# Patient Record
Sex: Female | Born: 1990 | Race: Black or African American | Hispanic: No | Marital: Single | Smoking: Former smoker
Health system: Southern US, Community
[De-identification: ages and names within clinical notes are randomized; demographics above are authoritative.]

## PROBLEM LIST (undated history)

## (undated) DIAGNOSIS — G8929 Other chronic pain: Secondary | ICD-10-CM

## (undated) DIAGNOSIS — R519 Headache, unspecified: Secondary | ICD-10-CM

## (undated) HISTORY — DX: Other chronic pain: G89.29

## (undated) HISTORY — PX: ABDOMINAL SURGERY: SHX537

## (undated) HISTORY — DX: Headache, unspecified: R51.9

---
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
## 2012-07-24 ENCOUNTER — Emergency Department: Payer: Self-pay | Admitting: Emergency Medicine

## 2012-07-24 LAB — WET PREP, GENITAL

## 2012-07-24 LAB — CBC
HCT: 41.9 % (ref 35.0–47.0)
MCHC: 32.1 g/dL (ref 32.0–36.0)
Platelet: 258 10*3/uL (ref 150–440)
RBC: 5.01 10*6/uL (ref 3.80–5.20)
RDW: 13.7 % (ref 11.5–14.5)

## 2012-07-24 LAB — URINALYSIS, COMPLETE
Glucose,UR: NEGATIVE mg/dL (ref 0–75)
Nitrite: NEGATIVE
Ph: 6 (ref 4.5–8.0)
RBC,UR: 15 /HPF (ref 0–5)
Specific Gravity: 1.025 (ref 1.003–1.030)
WBC UR: 27 /HPF (ref 0–5)

## 2012-07-24 LAB — COMPREHENSIVE METABOLIC PANEL
Albumin: 3.8 g/dL (ref 3.8–5.6)
Alkaline Phosphatase: 69 U/L — ABNORMAL LOW (ref 82–169)
Anion Gap: 7 (ref 7–16)
BUN: 7 mg/dL (ref 7–18)
Bilirubin,Total: 0.8 mg/dL (ref 0.2–1.0)
Calcium, Total: 9.2 mg/dL (ref 9.0–10.7)
Chloride: 101 mmol/L (ref 98–107)
Creatinine: 0.75 mg/dL (ref 0.60–1.30)
Potassium: 3.3 mmol/L — ABNORMAL LOW (ref 3.5–5.1)
SGOT(AST): 16 U/L (ref 0–26)
SGPT (ALT): 20 U/L (ref 12–78)
Total Protein: 8.4 g/dL (ref 6.4–8.6)

## 2013-07-25 DIAGNOSIS — F39 Unspecified mood [affective] disorder: Secondary | ICD-10-CM | POA: Insufficient documentation

## 2013-07-25 DIAGNOSIS — F1410 Cocaine abuse, uncomplicated: Secondary | ICD-10-CM | POA: Insufficient documentation

## 2013-12-27 IMAGING — CR DG ABDOMEN 1V
1 series · 1 of 1 positions shown · non-contrast
Comparison: none

REASON FOR EXAM: abdominal pain
COMMENTS:

PROCEDURE:     DXR - DXR KIDNEY URETER BLADDER  - July 24, 2012  [DATE]
RESULT:     Near and fecal material are seen in the colon. There is no
abnormal colonic or small bowel distention. There is a moderate amount of
air in the stomach. No definite radiopaque urinary tract stones are evident.

[ap]
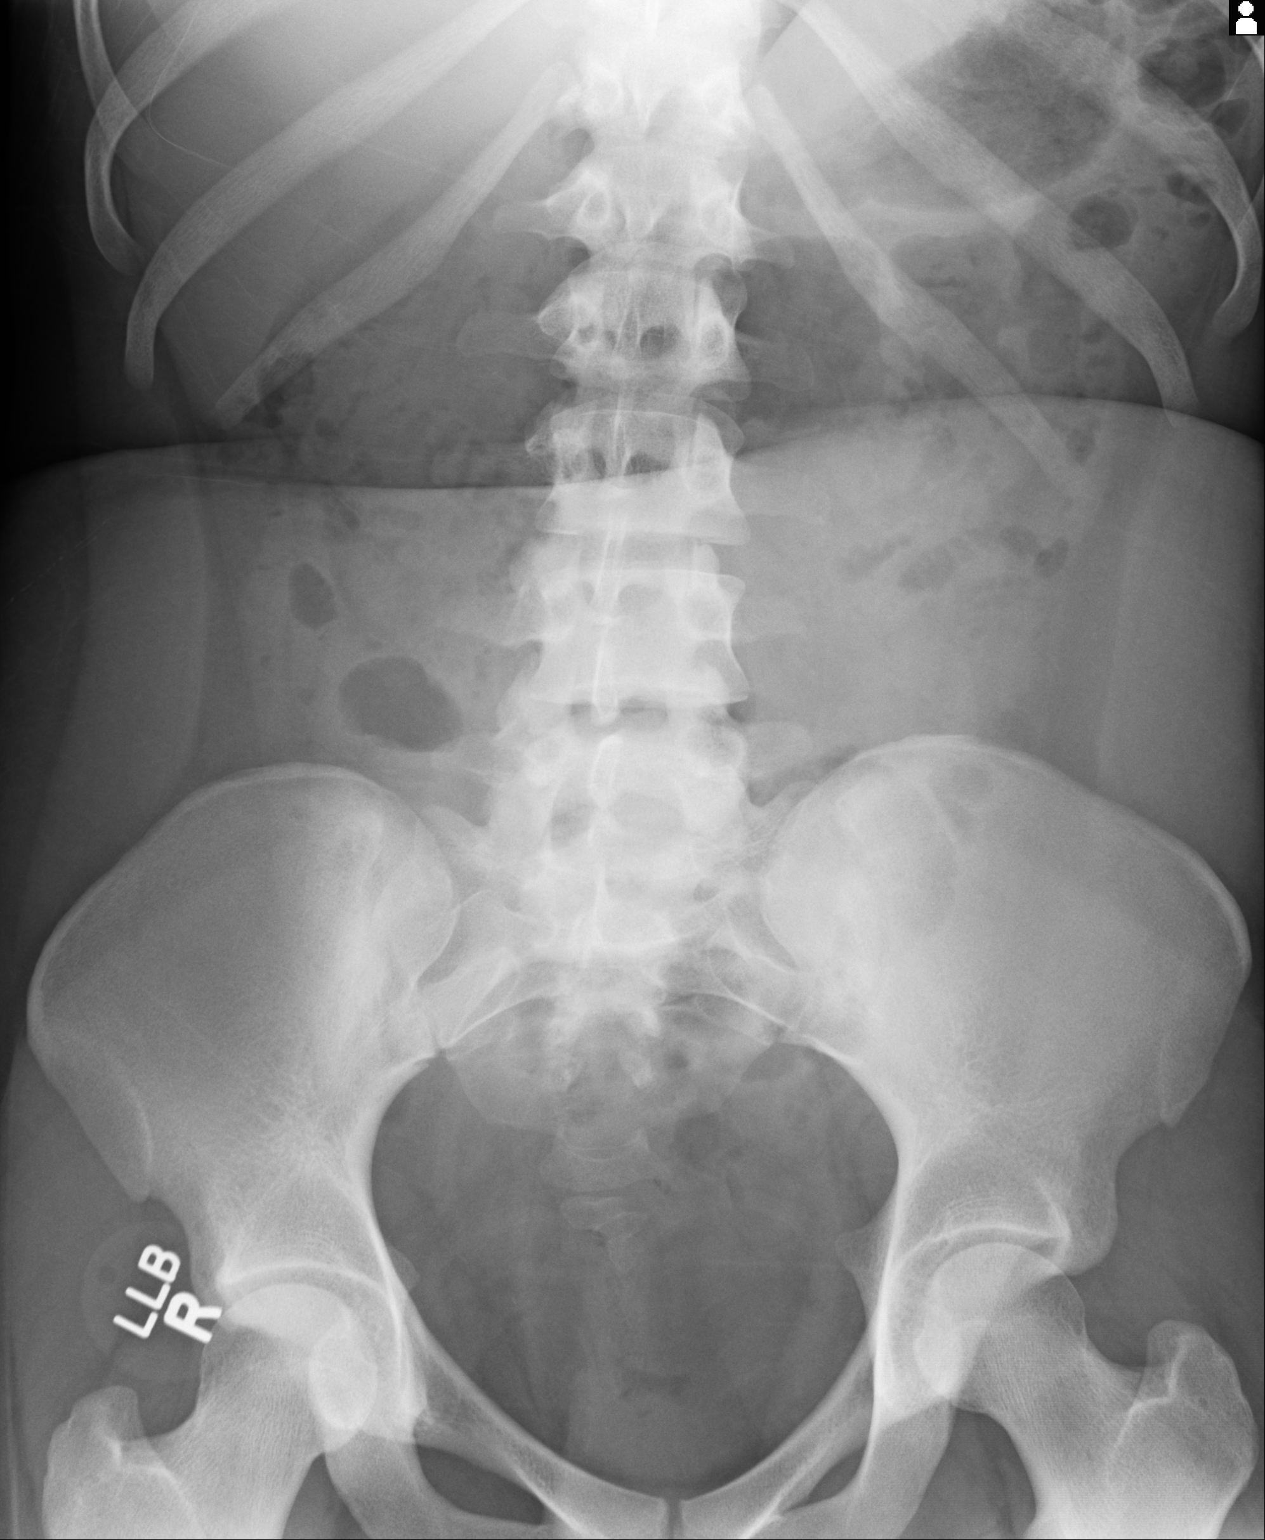

[1 of 1 positions shown; findings below may reference images not displayed]

IMPRESSION: No findings to suggest bowel obstruction or perforation.

[REDACTED]

## 2016-03-19 ENCOUNTER — Emergency Department
Admission: EM | Admit: 2016-03-19 | Discharge: 2016-03-19 | Disposition: A | Discharge status: Home / Self Care | Payer: Self-pay | Attending: Emergency Medicine | Admitting: Emergency Medicine

## 2016-03-19 ENCOUNTER — Encounter: Payer: Self-pay | Admitting: Emergency Medicine

## 2016-03-19 DIAGNOSIS — F17200 Nicotine dependence, unspecified, uncomplicated: Secondary | ICD-10-CM | POA: Insufficient documentation

## 2016-03-19 DIAGNOSIS — R350 Frequency of micturition: Secondary | ICD-10-CM | POA: Insufficient documentation

## 2016-03-19 LAB — URINALYSIS, COMPLETE (UACMP) WITH MICROSCOPIC
Bilirubin Urine: NEGATIVE
GLUCOSE, UA: NEGATIVE mg/dL
Hgb urine dipstick: NEGATIVE
KETONES UR: NEGATIVE mg/dL
Leukocytes, UA: NEGATIVE
Nitrite: NEGATIVE
PH: 6 (ref 5.0–8.0)
Protein, ur: NEGATIVE mg/dL
Specific Gravity, Urine: 1.025 (ref 1.005–1.030)

## 2016-03-19 LAB — COMPREHENSIVE METABOLIC PANEL
ALK PHOS: 46 U/L (ref 38–126)
ALT: 27 U/L (ref 14–54)
AST: 20 U/L (ref 15–41)
Albumin: 4.2 g/dL (ref 3.5–5.0)
Anion gap: 3 — ABNORMAL LOW (ref 5–15)
BILIRUBIN TOTAL: 0.6 mg/dL (ref 0.3–1.2)
BUN: 10 mg/dL (ref 6–20)
CALCIUM: 9 mg/dL (ref 8.9–10.3)
CO2: 29 mmol/L (ref 22–32)
CREATININE: 0.94 mg/dL (ref 0.44–1.00)
Chloride: 103 mmol/L (ref 101–111)
Glucose, Bld: 73 mg/dL (ref 65–99)
Potassium: 3.6 mmol/L (ref 3.5–5.1)
Sodium: 135 mmol/L (ref 135–145)
Total Protein: 7.5 g/dL (ref 6.5–8.1)

## 2016-03-19 LAB — CBC
HCT: 37.9 % (ref 35.0–47.0)
Hemoglobin: 12.4 g/dL (ref 12.0–16.0)
MCH: 27.6 pg (ref 26.0–34.0)
MCHC: 32.7 g/dL (ref 32.0–36.0)
MCV: 84.4 fL (ref 80.0–100.0)
PLATELETS: 267 10*3/uL (ref 150–440)
RBC: 4.49 MIL/uL (ref 3.80–5.20)
RDW: 13.6 % (ref 11.5–14.5)
WBC: 6.1 10*3/uL (ref 3.6–11.0)

## 2016-03-19 LAB — LIPASE, BLOOD: Lipase: 19 U/L (ref 11–51)

## 2016-03-19 LAB — POCT PREGNANCY, URINE: Preg Test, Ur: NEGATIVE

## 2016-03-19 NOTE — ED Triage Notes (Signed)
Pt reports LUQ abd pain for the past week. Pt states she has been nauseated and vomited x3 today.  Pt states she also has been trying to conceive as well. Took preg test a month ago and was negative. LMP 11/30. Pt states she was recently treated for vaginal DC but states she also had a UTI, but states [facility] told her she did not have one. Pt reports urinary frequency today, but denies any vaginal discharge.

## 2016-03-19 NOTE — ED Provider Notes (Signed)
[facility] Emergency Department Provider Note ____________________________________________   I have reviewed the triage vital signs and the triage nursing note.  HISTORY  Chief Complaint Abdominal Pain   Historian Patient  HPI Courtney Byrd is a 25 y.o. female presents due to a few weeks of frequency of urination.  She states she's being asked at work if there is a problem because she asks for some any bathroom brakes. She states that occasionally she will have some left upper abdominal discomfort, denies any now. She is mostly wondering whether or not she has urinary tract infection or if she is pregnant. She states that she's been trying to get pregnant for 3 years now without success. She does not have insurance and does not have a primary doctor or in OB/GYN doctor.  She states that she was seen at [facility] about a week ago and had a pelvic exam and STD testing and was told it was negative. She states that she was told that she did not have a urinary tract infection.  Symptoms of frequency and urination are mild. No hematuria. No pain with urination. No back pain. Denies vaginal discharge or vaginal bleeding although states that when her periods, it short but heavy.    History reviewed. No pertinent past medical history.  There are no active problems to display for this patient.   Past Surgical History:  Procedure Laterality Date  . ABDOMINAL SURGERY      Prior to Admission medications   Not on File    No Known Allergies  History reviewed. No pertinent family history.  Social History Social History  Substance Use Topics  . Smoking status: Current Every Day Smoker    Packs/day: 0.50    Years: 4.00  . Smokeless tobacco: Never Used  . Alcohol use Yes     Comment: rare use    Review of Systems  Constitutional: Negative for fever. Eyes: Negative for visual changes. ENT: Negative for sore throat. Cardiovascular: Negative for chest  pain. Respiratory: Negative for shortness of breath. Gastrointestinal: Negative for Diarrhea Genitourinary: Negative for dysuria. Positive for frequency of urination. Musculoskeletal: Negative for back pain. Skin: Negative for rash. Neurological: Negative for headache. 10 point Review of Systems otherwise negative ____________________________________________   PHYSICAL EXAM:  VITAL SIGNS: ED Triage Vitals  Enc Vitals Group     BP 03/19/16 1601 118/73     Pulse Rate 03/19/16 1601 84     Resp 03/19/16 1601 18     Temp 03/19/16 1601 98.3 F (36.8 C)     Temp Source 03/19/16 1601 Oral     SpO2 03/19/16 1601 100 %     Weight 03/19/16 1603 170 lb (77.1 kg)     Height 03/19/16 1603 5\' 3"  (1.6 m)     Head Circumference --      Peak Flow --      Pain Score 03/19/16 1603 5     Pain Loc --      Pain Edu? --      Excl. in GC? --      Constitutional: Alert and oriented. Well appearing and in no distress. HEENT   Head: Normocephalic and atraumatic.      Eyes: Conjunctivae are normal. PERRL. Normal extraocular movements.      Ears:         Nose: No congestion/rhinnorhea.   Mouth/Throat: Mucous membranes are moist.   Neck: No stridor. Cardiovascular/Chest: Normal rate, regular rhythm.  No murmurs, rubs, or gallops. Respiratory:  Normal respiratory effort without tachypnea nor retractions. Breath sounds are clear and equal bilaterally. No wheezes/rales/rhonchi. Gastrointestinal: Soft. No distention, no guarding, no rebound. Nontender in 4 quadrants.  Genitourinary/rectal: Declined Musculoskeletal: Nontender with normal range of motion in all extremities. No joint effusions.  No lower extremity tenderness.  No edema. Neurologic:  Normal speech and language. No gross or focal neurologic deficits are appreciated. Skin:  Skin is warm, dry and intact. No rash noted. Psychiatric: Mood and affect are normal. Speech and behavior are normal. Patient exhibits appropriate insight and  judgment.   ____________________________________________  LABS (pertinent positives/negatives)  Labs Reviewed  COMPREHENSIVE METABOLIC PANEL - Abnormal; Notable for the following:       Result Value   Anion gap 3 (*)    All other components within normal limits  URINALYSIS, COMPLETE (UACMP) WITH MICROSCOPIC - Abnormal; Notable for the following:    Color, Urine YELLOW (*)    APPearance HAZY (*)    Bacteria, UA RARE (*)    Squamous Epithelial / LPF 6-30 (*)    All other components within normal limits  URINE CULTURE  LIPASE, BLOOD  CBC  POC URINE PREG, ED  POCT PREGNANCY, URINE    ____________________________________________    EKG I, Governor Rooksebecca Tierria Watson, MD, the attending physician have personally viewed and interpreted all ECGs.  None ____________________________________________  RADIOLOGY All Xrays were viewed by me. Imaging interpreted by Radiologist.  None __________________________________________  PROCEDURES  Procedure(s) performed: None  Critical Care performed: None  ____________________________________________   ED COURSE / ASSESSMENT AND PLAN  Pertinent labs & imaging results that were available during my care of the patient were reviewed by me and considered in my medical decision making (see chart for details).    Ms. [name] is well-appearing and when brought back to the realm I was told she was stating that she was ready to go already.  While in to talk with her, it sounds like her main concern was whether or not she was pregnant, given that she's been trying to get pregnant for 3 years now. I did discuss with her that her pregnancy test is negative and that if indeed she has been tried for 3 years, that is probably outside the range of normal and if she is wanting to become pregnant, she should probably have a fertility evaluation with an OB/GYN. Given that she does not have insurance, she asked for referral, and I did indicate she could try the OB/GYN  on-call, or alternatively [facility]  The main symptom that she is having is frequency of urination, and her urinalysis does not appear to be consistent with urinary tract infection, I did discuss with her that I will send it for culture.  She's having no abdominal pain now. I did discuss that I recommended pelvic exam, she declined given that she was tested one week ago and I think this is probably reasonable.  I referred her for primary care doctor, as well as OB/GYN follow-up.    CONSULTATIONS:   None   Patient / Family / Caregiver informed of clinical course, medical decision-making process, and agree with plan.   I discussed return precautions, follow-up instructions, and discharge instructions with patient and/or family.   ___________________________________________   FINAL CLINICAL IMPRESSION(S) / ED DIAGNOSES   Final diagnoses:  Frequency of urination              Note: This dictation was prepared with Dragon dictation. Any transcriptional errors that result from  this process are unintentional    Governor Rooksebecca Shaquitta Burbridge, MD 03/19/16 [phone]

## 2016-03-19 NOTE — ED Notes (Signed)
See triage note  States she has had pain to left mid abd intermittent for the past week  Pain is worse after eating  No fever but has had some n/v  Vomited times 3 today

## 2016-03-19 NOTE — Discharge Instructions (Signed)
You were evaluated for frequent urination, and although no certain cause was found, your exam and evaluation are reassuring in the emergency department today.  I did recommend pelvic exam, but you declined given exam was done last week.  Return to the emergency department for any worsening symptoms including pain with urination, back pain, abdominal pain, black or bloody stool, vomiting, or any other symptoms concerning to you.  We did discuss that if you are trying to get pregnant, I would recommend seeing an OB/GYN for discussion of possible infertility evaluation.

## 2016-03-21 LAB — URINE CULTURE

## 2016-04-10 ENCOUNTER — Encounter: Payer: Self-pay | Admitting: Emergency Medicine

## 2016-04-10 ENCOUNTER — Emergency Department
Admission: EM | Admit: 2016-04-10 | Discharge: 2016-04-10 | Disposition: A | Discharge status: Home / Self Care | Payer: Self-pay | Attending: Emergency Medicine | Admitting: Emergency Medicine

## 2016-04-10 DIAGNOSIS — F17200 Nicotine dependence, unspecified, uncomplicated: Secondary | ICD-10-CM | POA: Insufficient documentation

## 2016-04-10 DIAGNOSIS — J029 Acute pharyngitis, unspecified: Secondary | ICD-10-CM | POA: Insufficient documentation

## 2016-04-10 LAB — POCT RAPID STREP A: STREPTOCOCCUS, GROUP A SCREEN (DIRECT): NEGATIVE

## 2016-04-10 MED ORDER — PSEUDOEPH-BROMPHEN-DM 30-2-10 MG/5ML PO SYRP: 5.0000 mL | ORAL_SOLUTION | Freq: Four times a day (QID) | ORAL | 0 refills | Status: AC | PRN

## 2016-04-10 MED ORDER — FIRST-DUKES MOUTHWASH MT SUSP: 10.0000 mL | Freq: Four times a day (QID) | OROMUCOSAL | 0 refills | Status: AC

## 2016-04-10 MED ORDER — LIDOCAINE VISCOUS 2 % MT SOLN
15.0000 mL | Freq: Once | OROMUCOSAL | Status: AC
  Administered 2016-04-10: 15 mL via OROMUCOSAL
  Filled 2016-04-10: qty 15

## 2016-04-10 NOTE — ED Triage Notes (Signed)
Sore throat since yesterday.

## 2016-04-10 NOTE — ED Provider Notes (Signed)
[facility] Emergency Department Provider Note   ____________________________________________   First MD Initiated Contact with Patient 04/10/16 1059     (approximate)  I have reviewed the triage vital signs and the nursing notes.   HISTORY  Chief Complaint Sore Throat    HPI Courtney Byrd is a 26 y.o. female patient complaining of sore throat for one day. Patient state difficulty with swallowing but can tolerate food and fluids. Patient also complaining of nasal congestion postnasal drainage. Patient denies any fever or chills with this complaint. Patient denies nausea, vomiting, or diarrhea. No palliative measures for this complaint. Patient rates the pain as a 10 over 10.   History reviewed. No pertinent past medical history.  There are no active problems to display for this patient.   Past Surgical History:  Procedure Laterality Date  . ABDOMINAL SURGERY      Prior to Admission medications   Medication Sig Start Date End Date Taking? Authorizing Provider  brompheniramine-pseudoephedrine-DM 30-2-10 MG/5ML syrup Take 5 mLs by mouth 4 (four) times daily as needed. 04/10/16   Joni Reiningonald K Kynzlee Hucker, PA-C  Diphenhyd-Hydrocort-Nystatin (FIRST-DUKES MOUTHWASH) SUSP Use as directed 10 mLs in the mouth or throat 4 (four) times daily. 04/10/16   Joni Reiningonald K Jaice Digioia, PA-C    Allergies Patient has no known allergies.  No family history on file.  Social History Social History  Substance Use Topics  . Smoking status: Current Every Day Smoker    Packs/day: 0.50    Years: 4.00  . Smokeless tobacco: Never Used  . Alcohol use Yes     Comment: rare use    Review of Systems Constitutional: No fever/chills Eyes: No visual changes. ENT: Sore throat and nasal congestion. Cardiovascular: Denies chest pain. Respiratory: Denies shortness of breath. Gastrointestinal: No abdominal pain.  No nausea, no vomiting.  No diarrhea.  No constipation. Genitourinary: Negative for  dysuria. Musculoskeletal: Negative for back pain. Skin: Negative for rash. Neurological: Negative for headaches, focal weakness or numbness.    ____________________________________________   PHYSICAL EXAM:  VITAL SIGNS: ED Triage Vitals  Enc Vitals Group     BP 04/10/16 0948 (!) 103/56     Pulse Rate 04/10/16 0948 100     Resp 04/10/16 0948 20     Temp 04/10/16 0948 98.1 F (36.7 C)     Temp Source 04/10/16 0948 Oral     SpO2 04/10/16 0948 100 %     Weight 04/10/16 0949 160 lb (72.6 kg)     Height 04/10/16 0949 5\' 3"  (1.6 m)     Head Circumference --      Peak Flow --      Pain Score 04/10/16 0949 10     Pain Loc --      Pain Edu? --      Excl. in GC? --     Constitutional: Alert and oriented. Well appearing and in no acute distress. Eyes: Conjunctivae are normal. PERRL. EOMI. Head: Atraumatic. Nose: Edematous nasal turbinates clear rhinorrhea Mouth/Throat: Mucous membranes are moist.  Oropharynx erythematous.No exudate on tonsils. Neck: No stridor.  No cervical spine tenderness to palpation. Hematological/Lymphatic/Immunilogical: No cervical lymphadenopathy. Cardiovascular: Normal rate, regular rhythm. Grossly normal heart sounds.  Good peripheral circulation. Respiratory: Normal respiratory effort.  No retractions. Lungs CTAB. Gastrointestinal: Soft and nontender. No distention. No abdominal bruits. No CVA tenderness. Musculoskeletal: No lower extremity tenderness nor edema.  No joint effusions. Neurologic:  Normal speech and language. No gross focal neurologic deficits are appreciated. No gait  instability. Skin:  Skin is warm, dry and intact. No rash noted. Psychiatric: Mood and affect are normal. Speech and behavior are normal.  ____________________________________________   LABS (all labs ordered are listed, but only abnormal results are displayed)  Labs Reviewed  POCT RAPID STREP A    ____________________________________________  EKG   ____________________________________________  RADIOLOGY   ____________________________________________   PROCEDURES  Procedure(s) performed: None  Procedures  Critical Care performed: No  ____________________________________________   INITIAL IMPRESSION / ASSESSMENT AND PLAN / ED COURSE  Pertinent labs & imaging results that were available during my care of the patient were reviewed by me and considered in my medical decision making (see chart for details).  Pharyngitis. Patient given discharge care instructions. Patient given a prescription at [facility] mouthwash) felt DM. Patient advised [facility]. Patient given a work note for today.  Clinical Course      ____________________________________________   FINAL CLINICAL IMPRESSION(S) / ED DIAGNOSES  Final diagnoses:  Viral pharyngitis      NEW MEDICATIONS STARTED DURING THIS VISIT:  New Prescriptions   BROMPHENIRAMINE-PSEUDOEPHEDRINE-DM 30-2-10 MG/5ML SYRUP    Take 5 mLs by mouth 4 (four) times daily as needed.   DIPHENHYD-HYDROCORT-NYSTATIN (FIRST-DUKES MOUTHWASH) SUSP    Use as directed 10 mLs in the mouth or throat 4 (four) times daily.     Note:  This document was prepared using Dragon voice recognition software and may include unintentional dictation errors.    Joni Reining, PA-C 04/10/16 1112    Phineas Semen, MD 04/10/16 [phone]

## 2017-02-01 ENCOUNTER — Encounter: Payer: Self-pay | Admitting: *Deleted

## 2017-02-01 ENCOUNTER — Emergency Department
Admission: EM | Admit: 2017-02-01 | Discharge: 2017-02-01 | Disposition: A | Discharge status: Home / Self Care | Payer: Self-pay | Attending: Emergency Medicine | Admitting: Emergency Medicine

## 2017-02-01 DIAGNOSIS — F17200 Nicotine dependence, unspecified, uncomplicated: Secondary | ICD-10-CM | POA: Insufficient documentation

## 2017-02-01 DIAGNOSIS — J029 Acute pharyngitis, unspecified: Secondary | ICD-10-CM | POA: Insufficient documentation

## 2017-02-01 DIAGNOSIS — G44209 Tension-type headache, unspecified, not intractable: Secondary | ICD-10-CM | POA: Insufficient documentation

## 2017-02-01 MED ORDER — AMOXICILLIN 500 MG PO TABS: 500.0000 mg | ORAL_TABLET | Freq: Two times a day (BID) | ORAL | 0 refills | Status: DC

## 2017-02-01 MED ORDER — CETIRIZINE HCL 10 MG PO TABS: 10.0000 mg | ORAL_TABLET | Freq: Every day | ORAL | 0 refills | Status: AC

## 2017-02-01 MED ORDER — IBUPROFEN 800 MG PO TABS
800.0000 mg | ORAL_TABLET | Freq: Once | ORAL | Status: AC
  Administered 2017-02-01: 800 mg via ORAL
  Filled 2017-02-01: qty 1

## 2017-02-01 MED ORDER — FLUTICASONE PROPIONATE 50 MCG/ACT NA SUSP: 1.0000 | Freq: Every day | NASAL | 0 refills | Status: AC

## 2017-02-01 MED ORDER — IBUPROFEN 800 MG PO TABS: 800.0000 mg | ORAL_TABLET | Freq: Three times a day (TID) | ORAL | 0 refills | Status: AC | PRN

## 2017-02-01 NOTE — ED Triage Notes (Signed)
States sore throat, headache and nasal congestion for 1 week

## 2017-02-01 NOTE — ED Provider Notes (Signed)
[facility] Emergency Department Provider Note   ____________________________________________   I have reviewed the triage vital signs and the nursing notes.   HISTORY  Chief Complaint Headache and Sore Throat    HPI Courtney Byrd is a 26 y.o. female presents to the emergency department with sore throat, headache, sinus pressure, watery and itchy eyes and nasal congestion for approximately 1 week.  Patient denies associated fever with above symptoms or sick contacts.  Patient reports history of strep throat in the past although current sore throat does not feel similar.  Patient works in [employer] so there is a high likelihood of sick contacts in her workplace.  Patient reported headache occurred after other symptoms had persisted.  Patient reports a history of environmental and seasonal allergies. Patient denies fever, chills, headache, vision changes, chest pain, chest tightness, shortness of breath, abdominal pain, nausea and vomiting.  History reviewed. No pertinent past medical history.  There are no active problems to display for this patient.   Past Surgical History:  Procedure Laterality Date  . ABDOMINAL SURGERY      Prior to Admission medications   Medication Sig Start Date End Date Taking? Authorizing Provider  amoxicillin (AMOXIL) 500 MG tablet Take 1 tablet (500 mg total) by mouth 2 (two) times daily. 02/01/17   Martell Mcfadyen M, PA-C  brompheniramine-pseudoephedrine-DM 30-2-10 MG/5ML syrup Take 5 mLs by mouth 4 (four) times daily as needed. 04/10/16   Joni ReiningSmith, Ronald K, PA-C  cetirizine (ZYRTEC) 10 MG tablet Take 1 tablet (10 mg total) by mouth daily. 02/01/17   Jaydyn Menon M, PA-C  Diphenhyd-Hydrocort-Nystatin (FIRST-DUKES MOUTHWASH) SUSP Use as directed 10 mLs in the mouth or throat 4 (four) times daily. 04/10/16   Joni ReiningSmith, Ronald K, PA-C  fluticasone (FLONASE) 50 MCG/ACT nasal spray Place 1 spray into both nostrils daily. 02/01/17 02/01/18   Jenipher Havel M, PA-C  ibuprofen (ADVIL,MOTRIN) 800 MG tablet Take 1 tablet (800 mg total) by mouth every 8 (eight) hours as needed. 02/01/17   Rigo Letts M, PA-C    Allergies Patient has no known allergies.  History reviewed. No pertinent family history.  Social History Social History  Substance Use Topics  . Smoking status: Current Every Day Smoker    Packs/day: 0.50    Years: 4.00  . Smokeless tobacco: Never Used  . Alcohol use Yes     Comment: rare use    Review of Systems Constitutional: Negative for fever/chills Eyes: No visual changes.  Positive for itchy and watery eyes. ENT:  Positive for sore throat and negative for difficulty swallowing.  Sinus pressure and nasal congestion. Cardiovascular: Denies chest pain. Respiratory: Denies cough. Denies shortness of breath. Gastrointestinal: No abdominal pain.  No nausea, vomiting, diarrhea. Skin: Negative for rash. Neurological: Positive for headaches.  ____________________________________________   PHYSICAL EXAM:  VITAL SIGNS: ED Triage Vitals  Enc Vitals Group     BP 02/01/17 1809 101/64     Pulse Rate 02/01/17 1809 80     Resp 02/01/17 1809 20     Temp 02/01/17 1809 98.7 F (37.1 C)     Temp Source 02/01/17 1809 Oral     SpO2 02/01/17 1809 100 %     Weight 02/01/17 1701 160 lb (72.6 kg)     Height 02/01/17 1701 5\' 3"  (1.6 m)     Head Circumference --      Peak Flow --      Pain Score 02/01/17 1701 7     Pain  Loc --      Pain Edu? --      Excl. in GC? --     Constitutional: Alert and oriented. Well appearing and in no acute distress.  Eyes: Conjunctivae are normal. PERRL.  Head: Normocephalic and atraumatic. ENT: Sinus pressure with positive tap test over frontal and maxillary sinus cavities.      Ears: Canals clear. TMs intact bilaterally.      Nose: Congestion/rhinnorhea.  Nasal congestion.      Mouth/Throat: Mucous membranes are moist. Oropharynx mild erythema.  No exudate present.  Uvula  midline. Neck:Supple. Cardiovascular: Normal rate, regular rhythm. Respiratory: Normal respiratory effort without tachypnea or retractions. Lungs CTAB.  Hematological/Lymphatic/Immunological: No cervical lymphadenopathy. Neurologic: Normal speech and language.  Skin:  Skin is warm, dry and intact. No rash noted. Psychiatric: Mood and affect are normal. Speech and behavior are normal. Patient exhibits appropriate insight and judgement.  ____________________________________________   LABS (all labs ordered are listed, but only abnormal results are displayed)  Labs Reviewed  POCT RAPID STREP A   ____________________________________________  EKG none ____________________________________________  RADIOLOGY none ____________________________________________   PROCEDURES  Procedure(s) performed: no    Critical Care performed: no ____________________________________________   INITIAL IMPRESSION / ASSESSMENT AND PLAN / ED COURSE  Pertinent labs & imaging results that were available during my care of the patient were reviewed by me and considered in my medical decision making (see chart for details).  Patient presents to the emergency department sore throat, headache, sinus pressure, watery and itchy eyes and nasal congestion for approximately 1 week. History and physical exam are reassuring symptoms are consistent with nasopharyngitis.  Point-of-care strep negative.  Patient will be prescribed amoxicillin for antibiotic coverage.  Recommended patient begin a regimen of Zyrtec and Flonase to manage allergy symptoms. Physical exam and vital signs were reassuring at this time. Patient informed of clinical course, understand medical decision-making process, and agree with plan. Patient was advised to follow up with PCPand was also advised to return to the emergency department for symptoms that change or worsen. ____________________________________________   FINAL CLINICAL  IMPRESSION(S) / ED DIAGNOSES  Final diagnoses:  Pharyngitis, unspecified etiology  Sore throat  Acute non intractable tension-type headache       NEW MEDICATIONS STARTED DURING THIS VISIT:  Discharge Medication List as of 02/01/2017  6:01 PM    START taking these medications   Details  amoxicillin (AMOXIL) 500 MG tablet Take 1 tablet (500 mg total) by mouth 2 (two) times daily., Starting Tue 02/01/2017, Print    cetirizine (ZYRTEC) 10 MG tablet Take 1 tablet (10 mg total) by mouth daily., Starting Tue 02/01/2017, Print    fluticasone (FLONASE) 50 MCG/ACT nasal spray Place 1 spray into both nostrils daily., Starting Tue 02/01/2017, Until Wed 02/01/2018, Print    ibuprofen (ADVIL,MOTRIN) 800 MG tablet Take 1 tablet (800 mg total) by mouth every 8 (eight) hours as needed., Starting Tue 02/01/2017, Print         Note:  This document was prepared using Dragon voice recognition software and may include unintentional dictation errors.   Clois Comber, PA-C 02/02/17 1012    Phineas Semen, MD 02/03/17 [phone]

## 2017-02-01 NOTE — Discharge Instructions (Signed)
Take medication as prescribed. Return to emergency department if symptoms worsen and follow-up with PCP as needed.   °

## 2017-02-02 LAB — POCT RAPID STREP A: Streptococcus, Group A Screen (Direct): NEGATIVE

## 2017-03-06 ENCOUNTER — Emergency Department
Admission: EM | Admit: 2017-03-06 | Discharge: 2017-03-06 | Disposition: A | Discharge status: Home / Self Care | Payer: Self-pay | Attending: Emergency Medicine | Admitting: Emergency Medicine

## 2017-03-06 ENCOUNTER — Other Ambulatory Visit: Payer: Self-pay

## 2017-03-06 ENCOUNTER — Encounter: Payer: Self-pay | Admitting: Emergency Medicine

## 2017-03-06 DIAGNOSIS — F17200 Nicotine dependence, unspecified, uncomplicated: Secondary | ICD-10-CM | POA: Insufficient documentation

## 2017-03-06 DIAGNOSIS — L0291 Cutaneous abscess, unspecified: Secondary | ICD-10-CM

## 2017-03-06 DIAGNOSIS — Z79899 Other long term (current) drug therapy: Secondary | ICD-10-CM | POA: Insufficient documentation

## 2017-03-06 DIAGNOSIS — N764 Abscess of vulva: Secondary | ICD-10-CM | POA: Insufficient documentation

## 2017-03-06 MED ORDER — HYDROCODONE-ACETAMINOPHEN 5-325 MG PO TABS
1.0000 | ORAL_TABLET | Freq: Once | ORAL | Status: AC
Start: 2017-03-06 — End: 2017-03-06
  Administered 2017-03-06: 1 via ORAL
  Filled 2017-03-06: qty 1

## 2017-03-06 MED ORDER — SULFAMETHOXAZOLE-TRIMETHOPRIM 800-160 MG PO TABS: 1.0000 | ORAL_TABLET | Freq: Two times a day (BID) | ORAL | 0 refills | Status: AC

## 2017-03-06 MED ORDER — LIDOCAINE HCL (PF) 1 % IJ SOLN
5.0000 mL | Freq: Once | INTRAMUSCULAR | Status: DC
  Filled 2017-03-06: qty 5

## 2017-03-06 NOTE — ED Provider Notes (Signed)
[facility] Emergency Department Provider Note  ____________________________________________  Time seen: Approximately 3:49 PM  I have reviewed the triage vital signs and the nursing notes.   HISTORY  Chief Complaint Abscess   HPI Courtney Byrd is a 26 y.o. female presents to the emergency department with a right labial abscess.  Patient reports that abscesses been apparent for the past 3 days.  She has a history of folliculitis but has never had a cutaneous abscess.  She has tried warm compress these.  She denies fever and chills.  History reviewed. No pertinent past medical history.  There are no active problems to display for this patient.   Past Surgical History:  Procedure Laterality Date  . ABDOMINAL SURGERY      Prior to Admission medications   Medication Sig Start Date End Date Taking? Authorizing Provider  amoxicillin (AMOXIL) 500 MG tablet Take 1 tablet (500 mg total) by mouth 2 (two) times daily. 02/01/17   Little, Traci M, PA-C  brompheniramine-pseudoephedrine-DM 30-2-10 MG/5ML syrup Take 5 mLs by mouth 4 (four) times daily as needed. 04/10/16   Joni ReiningSmith, Ronald K, PA-C  cetirizine (ZYRTEC) 10 MG tablet Take 1 tablet (10 mg total) by mouth daily. 02/01/17   Little, Traci M, PA-C  Diphenhyd-Hydrocort-Nystatin (FIRST-DUKES MOUTHWASH) SUSP Use as directed 10 mLs in the mouth or throat 4 (four) times daily. 04/10/16   Joni ReiningSmith, Ronald K, PA-C  fluticasone (FLONASE) 50 MCG/ACT nasal spray Place 1 spray into both nostrils daily. 02/01/17 02/01/18  Little, Traci M, PA-C  ibuprofen (ADVIL,MOTRIN) 800 MG tablet Take 1 tablet (800 mg total) by mouth every 8 (eight) hours as needed. 02/01/17   Little, Traci M, PA-C  sulfamethoxazole-trimethoprim (BACTRIM DS,SEPTRA DS) 800-160 MG tablet Take 1 tablet by mouth 2 (two) times daily for 7 days. 03/06/17 03/13/17  Orvil FeilWoods, Elliott Lasecki M, PA-C    Allergies Patient has no known allergies.  History reviewed. No pertinent family  history.  Social History Social History   Tobacco Use  . Smoking status: Current Every Day Smoker    Packs/day: 0.50    Years: 4.00    Pack years: 2.00  . Smokeless tobacco: Never Used  Substance Use Topics  . Alcohol use: Yes    Comment: rare use  . Drug use: No     Review of Systems  Constitutional: No fever/chills Eyes: No visual changes. No discharge ENT: No upper respiratory complaints. Cardiovascular: no chest pain. Respiratory: no cough. No SOB. Musculoskeletal: Negative for musculoskeletal pain. Skin: Patient has a right labial abscess. Neurological: Negative for headaches, focal weakness or numbness.   ____________________________________________   PHYSICAL EXAM:  VITAL SIGNS: ED Triage Vitals  Enc Vitals Group     BP 03/06/17 1510 (!) 132/58     Pulse Rate 03/06/17 1510 93     Resp --      Temp 03/06/17 1510 98.5 F (36.9 C)     Temp Source 03/06/17 1510 Oral     SpO2 03/06/17 1510 100 %     Weight 03/06/17 1510 160 lb (72.6 kg)     Height 03/06/17 1510 5\' 3"  (1.6 m)     Head Circumference --      Peak Flow --      Pain Score 03/06/17 1525 10     Pain Loc --      Pain Edu? --      Excl. in GC? --      Constitutional: Alert and oriented. Well appearing and in no  acute distress. Eyes: Conjunctivae are normal. PERRL. EOMI. Head: Atraumatic. Cardiovascular: Normal rate, regular rhythm. Normal S1 and S2.  Good peripheral circulation. Respiratory: Normal respiratory effort without tachypnea or retractions. Lungs CTAB. Good air entry to the bases with no decreased or absent breath sounds. Gastrointestinal: Bowel sounds 4 quadrants. Soft and nontender to palpation. No guarding or rigidity. No palpable masses. No distention. No CVA tenderness. Musculoskeletal: Full range of motion to all extremities. No gross deformities appreciated. Neurologic:  Normal speech and language. No gross focal neurologic deficits are appreciated.  Skin: Patient has a 3 cm x  3 cm right labial abscess.  Abscess is fluctuant with surrounding cellulitis. Psychiatric: Mood and affect are normal. Speech and behavior are normal. Patient exhibits appropriate insight and judgement.   ____________________________________________   LABS (all labs ordered are listed, but only abnormal results are displayed)  Labs Reviewed - No data to display ____________________________________________  EKG   ____________________________________________  RADIOLOGY   No results found.  ____________________________________________    PROCEDURES  Procedure(s) performed:    Procedures  INCISION AND DRAINAGE Performed by: Orvil FeilJaclyn M Marzelle Rutten Consent: Verbal consent obtained. Risks and benefits: risks, benefits and alternatives were discussed Type: abscess  Body area: Right labia majora  Anesthesia: local infiltration  Incision was made with a scalpel.  Local anesthetic: lidocaine 1% without epinephrine  Anesthetic total: 3 ml  Complexity: complex Blunt dissection to break up loculations  Drainage: purulent  Drainage amount: Copious  Patient tolerance: Patient tolerated the procedure well with no immediate complications.     Medications  HYDROcodone-acetaminophen (NORCO/VICODIN) 5-325 MG per tablet 1 tablet (1 tablet Oral Given 03/06/17 1553)     ____________________________________________   INITIAL IMPRESSION / ASSESSMENT AND PLAN / ED COURSE  Pertinent labs & imaging results that were available during my care of the patient were reviewed by me and considered in my medical decision making (see chart for details).  Review of the [place] CSRS was performed in accordance of the [facility] prior to dispensing any controlled drugs.     Assessment and Plan: Right labial abscess Patient presents to the emergency department with an abscess of the right labia majora.  Patient underwent incision and drainage without complication.  She was discharged with Bactrim  and advised to follow-up with primary care as needed.  All patient questions were answered.     ____________________________________________  FINAL CLINICAL IMPRESSION(S) / ED DIAGNOSES  Final diagnoses:  Abscess      NEW MEDICATIONS STARTED DURING THIS VISIT:  ED Discharge Orders        Ordered    sulfamethoxazole-trimethoprim (BACTRIM DS,SEPTRA DS) 800-160 MG tablet  2 times daily     03/06/17 1630          This chart was dictated using voice recognition software/Dragon. Despite best efforts to proofread, errors can occur which can change the meaning. Any change was purely unintentional.    Gasper LloydWoods, Malia Corsi M, PA-C 03/06/17 2018    Sharman CheekStafford, Phillip, MD 03/06/17 2312

## 2017-03-06 NOTE — ED Triage Notes (Signed)
Pt presents to ED with c/o possible abscess or ingrown hair to R side of her vagina. Pt states this started 2-3 days ago. Pt states her mother told her she could see a "head" and that it's possible that there are 2 abscesses.

## 2017-04-27 ENCOUNTER — Emergency Department
Admission: EM | Admit: 2017-04-27 | Discharge: 2017-04-27 | Disposition: A | Discharge status: Home / Self Care | Payer: Self-pay | Attending: Emergency Medicine | Admitting: Emergency Medicine

## 2017-04-27 DIAGNOSIS — J020 Streptococcal pharyngitis: Secondary | ICD-10-CM | POA: Insufficient documentation

## 2017-04-27 DIAGNOSIS — Z79899 Other long term (current) drug therapy: Secondary | ICD-10-CM | POA: Insufficient documentation

## 2017-04-27 DIAGNOSIS — F17200 Nicotine dependence, unspecified, uncomplicated: Secondary | ICD-10-CM | POA: Insufficient documentation

## 2017-04-27 LAB — GROUP A STREP BY PCR: Group A Strep by PCR: DETECTED — AB

## 2017-04-27 MED ORDER — DEXAMETHASONE SODIUM PHOSPHATE 10 MG/ML IJ SOLN
10.0000 mg | Freq: Once | INTRAMUSCULAR | Status: AC
  Administered 2017-04-27: 10 mg via INTRAMUSCULAR
  Filled 2017-04-27: qty 1

## 2017-04-27 MED ORDER — AMOXICILLIN 875 MG PO TABS: 875.0000 mg | ORAL_TABLET | Freq: Two times a day (BID) | ORAL | 0 refills | Status: AC

## 2017-04-27 NOTE — ED Provider Notes (Signed)
[facility] Emergency Department Provider Note  ____________________________________________  Time seen: Approximately 10:05 PM  I have reviewed the triage vital signs and the nursing notes.   HISTORY  Chief Complaint Sore Throat    HPI Courtney Byrd is a 27 y.o. female presents to the emergency department with pharyngitis, fever, tonsillar exudate, anterior neck discomfort and malaise for the past 3 days.  Patient is speaking in complete sentences and tolerating her own secretions.  No prior history of peritonsillar abscess.  No alleviating measures have been attempted.   History reviewed. No pertinent past medical history.  There are no active problems to display for this patient.   Past Surgical History:  Procedure Laterality Date  . ABDOMINAL SURGERY      Prior to Admission medications   Medication Sig Start Date End Date Taking? Authorizing Provider  amoxicillin (AMOXIL) 875 MG tablet Take 1 tablet (875 mg total) by mouth 2 (two) times daily for 10 days. 04/27/17 05/07/17  Orvil Feil, PA-C  brompheniramine-pseudoephedrine-DM 30-2-10 MG/5ML syrup Take 5 mLs by mouth 4 (four) times daily as needed. 04/10/16   Joni Reining, PA-C  cetirizine (ZYRTEC) 10 MG tablet Take 1 tablet (10 mg total) by mouth daily. 02/01/17   Little, Traci M, PA-C  Diphenhyd-Hydrocort-Nystatin (FIRST-DUKES MOUTHWASH) SUSP Use as directed 10 mLs in the mouth or throat 4 (four) times daily. 04/10/16   Joni Reining, PA-C  fluticasone (FLONASE) 50 MCG/ACT nasal spray Place 1 spray into both nostrils daily. 02/01/17 02/01/18  Little, Traci M, PA-C  ibuprofen (ADVIL,MOTRIN) 800 MG tablet Take 1 tablet (800 mg total) by mouth every 8 (eight) hours as needed. 02/01/17   Little, Traci M, PA-C    Allergies Patient has no known allergies.  No family history on file.  Social History Social History   Tobacco Use  . Smoking status: Current Every Day Smoker    Packs/day: 0.50   Years: 4.00    Pack years: 2.00  . Smokeless tobacco: Never Used  Substance Use Topics  . Alcohol use: Yes    Comment: rare use  . Drug use: No     Review of Systems  Constitutional: Patient has fever. Eyes: No visual changes. No discharge ENT: Patient has pharyngitis.  Cardiovascular: no chest pain. Respiratory: no cough. No SOB. Gastrointestinal: Patient has nausea.  Genitourinary: Negative for dysuria. No hematuria Musculoskeletal: Negative for musculoskeletal pain. Skin: Negative for rash, abrasions, lacerations, ecchymosis. Neurological: Patient has headache, no focal weakness or numbness.   ____________________________________________   PHYSICAL EXAM:  VITAL SIGNS: ED Triage Vitals  Enc Vitals Group     BP 04/27/17 1945 122/67     Pulse Rate 04/27/17 1945 (!) 109     Resp 04/27/17 1945 18     Temp 04/27/17 1945 (!) 100.7 F (38.2 C)     Temp Source 04/27/17 1945 Oral     SpO2 04/27/17 1945 99 %     Weight 04/27/17 1945 160 lb (72.6 kg)     Height 04/27/17 1945 5\' 3"  (1.6 m)     Head Circumference --      Peak Flow --      Pain Score 04/27/17 1949 10     Pain Loc --      Pain Edu? --      Excl. in GC? --      Constitutional: Alert and oriented. Well appearing and in no acute distress. Eyes: Conjunctivae are normal. PERRL. EOMI. Head: Atraumatic. ENT:  Ears: TMs are pearly bilaterally.      Nose: No congestion/rhinnorhea.      Mouth/Throat: Mucous membranes are moist.  Posterior pharynx is erythematous with bilateral tonsillar hypertrophy and exudate. Neck: No stridor.  Palpable cervical spine tenderness to palpation. Cardiovascular: Normal rate, regular rhythm. Normal S1 and S2.  Good peripheral circulation. Respiratory: Normal respiratory effort without tachypnea or retractions. Lungs CTAB. Good air entry to the bases with no decreased or absent breath sounds. Gastrointestinal: Bowel sounds 4 quadrants. Soft and nontender to palpation. No guarding  or rigidity. No palpable masses. No distention. No CVA tenderness. Skin:  Skin is warm, dry and intact. No rash noted. ____________________________________________   LABS (all labs ordered are listed, but only abnormal results are displayed)  Labs Reviewed  GROUP A STREP BY PCR - Abnormal; Notable for the following components:      Result Value   Group A Strep by PCR DETECTED (*)    All other components within normal limits   ____________________________________________  EKG   ____________________________________________  RADIOLOGY   No results found.  ____________________________________________    PROCEDURES  Procedure(s) performed:    Procedures    Medications  dexamethasone (DECADRON) injection 10 mg (10 mg Intramuscular Given 04/27/17 2118)     ____________________________________________   INITIAL IMPRESSION / ASSESSMENT AND PLAN / ED COURSE  Pertinent labs & imaging results that were available during my care of the patient were reviewed by me and considered in my medical decision making (see chart for details).  Review of the [place] CSRS was performed in accordance of the [facility] prior to dispensing any controlled drugs.     Assessment and plan Strep pharyngitis Patient presents to the emergency department with fever, pharyngitis, tonsillar exudate and anterior neck discomfort for the past 2 days.  Differential diagnosis included strep pharyngitis versus viral URI.  Patient tested positive for strep in the emergency department.  Patient was treated empirically with amoxicillin.  Vital signs were reassuring prior to discharge.  All patient questions were answered.    ____________________________________________  FINAL CLINICAL IMPRESSION(S) / ED DIAGNOSES  Final diagnoses:  Strep pharyngitis      NEW MEDICATIONS STARTED DURING THIS VISIT:  ED Discharge Orders        Ordered    amoxicillin (AMOXIL) 875 MG tablet  2 times daily     04/27/17 2106           This chart was dictated using voice recognition software/Dragon. Despite best efforts to proofread, errors can occur which can change the meaning. Any change was purely unintentional.    Gasper LloydWoods, Nekisha Mcdiarmid M, PA-C 04/27/17 2211    Sharman CheekStafford, Phillip, MD 04/27/17 2330

## 2017-04-27 NOTE — ED Triage Notes (Signed)
Patient c/o sore throat X 3 days. Patient reports headache.

## 2017-04-27 NOTE — ED Notes (Signed)
Pt has a  Sore throat for 2 days .  Fever today.  No cough.  Pt states it hurts to swallow.  Pt alert.

## 2018-05-07 ENCOUNTER — Emergency Department
Admission: EM | Admit: 2018-05-07 | Discharge: 2018-05-07 | Disposition: A | Discharge status: Home / Self Care | Payer: Self-pay | Attending: Emergency Medicine | Admitting: Emergency Medicine

## 2018-05-07 DIAGNOSIS — H66001 Acute suppurative otitis media without spontaneous rupture of ear drum, right ear: Secondary | ICD-10-CM | POA: Insufficient documentation

## 2018-05-07 DIAGNOSIS — F17200 Nicotine dependence, unspecified, uncomplicated: Secondary | ICD-10-CM | POA: Insufficient documentation

## 2018-05-07 DIAGNOSIS — Z79899 Other long term (current) drug therapy: Secondary | ICD-10-CM | POA: Insufficient documentation

## 2018-05-07 MED ORDER — AMOXICILLIN 875 MG PO TABS: 875.0000 mg | ORAL_TABLET | Freq: Two times a day (BID) | ORAL | 0 refills | Status: AC

## 2018-05-07 NOTE — ED Triage Notes (Signed)
Patient to ED for right ear pain and possible infection. Woke up with suddent onset of ear pain this morning; thought something was in her ear. Put peroxide in it and that stopped. Since then has been increasingly sore and states "I just got medicaid so I'm gonna get it all taken care of." Denies drainage or blood coming from the ear.

## 2018-05-07 NOTE — ED Provider Notes (Signed)
[facility] Emergency Department Provider Note  ____________________________________________  Time seen: Approximately 9:55 PM  I have reviewed the triage vital signs and the nursing notes.   HISTORY  Chief Complaint Otitis Media    HPI Courtney Byrd is a 28 y.o. female presents to the emergency department with 8 out of 10 aching right ear pain that started today.  Patient had rhinorrhea, congestion nonproductive cough earlier in the week.  No recent episodes of otitis media.  No changes in hearing or discharge from the right ear.  No tinnitus.  No alleviating measures have been attempted.   History reviewed. No pertinent past medical history.  There are no active problems to display for this patient.   Past Surgical History:  Procedure Laterality Date  . ABDOMINAL SURGERY      Prior to Admission medications   Medication Sig Start Date End Date Taking? Authorizing Provider  amoxicillin (AMOXIL) 875 MG tablet Take 1 tablet (875 mg total) by mouth 2 (two) times daily for 7 days. 05/07/18 05/14/18  Orvil FeilWoods, Chrysa Rampy M, PA-C  brompheniramine-pseudoephedrine-DM 30-2-10 MG/5ML syrup Take 5 mLs by mouth 4 (four) times daily as needed. 04/10/16   Joni ReiningSmith, Ronald K, PA-C  cetirizine (ZYRTEC) 10 MG tablet Take 1 tablet (10 mg total) by mouth daily. 02/01/17   Little, Traci M, PA-C  Diphenhyd-Hydrocort-Nystatin (FIRST-DUKES MOUTHWASH) SUSP Use as directed 10 mLs in the mouth or throat 4 (four) times daily. 04/10/16   Joni ReiningSmith, Ronald K, PA-C  fluticasone (FLONASE) 50 MCG/ACT nasal spray Place 1 spray into both nostrils daily. 02/01/17 02/01/18  Little, Traci M, PA-C  ibuprofen (ADVIL,MOTRIN) 800 MG tablet Take 1 tablet (800 mg total) by mouth every 8 (eight) hours as needed. 02/01/17   Little, Traci M, PA-C    Allergies Patient has no known allergies.  No family history on file.  Social History Social History   Tobacco Use  . Smoking status: Current Every Day Smoker   Packs/day: 0.50    Years: 4.00    Pack years: 2.00  . Smokeless tobacco: Never Used  Substance Use Topics  . Alcohol use: Yes    Comment: rare use  . Drug use: No     Review of Systems  Constitutional: No fever/chills Eyes: No visual changes. No discharge ENT: Patient has right ear pain.  Cardiovascular: no chest pain. Respiratory: no cough. No SOB. Gastrointestinal: No abdominal pain.  No nausea, no vomiting.  No diarrhea.  No constipation. Musculoskeletal: Negative for musculoskeletal pain. Skin: Negative for rash, abrasions, lacerations, ecchymosis. Neurological: Negative for headaches, focal weakness or numbness.  ____________________________________________   PHYSICAL EXAM:  VITAL SIGNS: ED Triage Vitals  Enc Vitals Group     BP 05/07/18 2020 106/67     Pulse Rate 05/07/18 2020 83     Resp 05/07/18 2020 20     Temp 05/07/18 2020 98.2 F (36.8 C)     Temp Source 05/07/18 2020 Oral     SpO2 05/07/18 2020 100 %     Weight 05/07/18 2021 160 lb (72.6 kg)     Height 05/07/18 2021 5\' 3"  (1.6 m)     Head Circumference --      Peak Flow --      Pain Score 05/07/18 2021 7     Pain Loc --      Pain Edu? --      Excl. in GC? --      Constitutional: Alert and oriented. Well appearing and in no acute  distress. Eyes: Conjunctivae are normal. PERRL. EOMI. Head: Atraumatic. ENT:      Ears: Right tympanic membrane is erythematous and effused.  Left TM is pearly.      Nose: No congestion/rhinnorhea.      Mouth/Throat: Mucous membranes are moist.  Neck: No stridor.  No cervical spine tenderness to palpation.  Cardiovascular: Normal rate, regular rhythm. Normal S1 and S2.  Good peripheral circulation. Respiratory: Normal respiratory effort without tachypnea or retractions. Lungs CTAB. Good air entry to the bases with no decreased or absent breath sounds. Skin:  Skin is warm, dry and intact. No rash noted. Psychiatric: Mood and affect are normal. Speech and behavior are  normal. Patient exhibits appropriate insight and judgement.   ____________________________________________   LABS (all labs ordered are listed, but only abnormal results are displayed)  Labs Reviewed - No data to display ____________________________________________  EKG   ____________________________________________  RADIOLOGY  No results found.  ____________________________________________    PROCEDURES  Procedure(s) performed:    Procedures    Medications - No data to display   ____________________________________________   INITIAL IMPRESSION / ASSESSMENT AND PLAN / ED COURSE  Pertinent labs & imaging results that were available during my care of the patient were reviewed by me and considered in my medical decision making (see chart for details).  Review of the [place] CSRS was performed in accordance of the [facility] prior to dispensing any controlled drugs.      Assessment and plan Otitis media Patient presents to the emergency department with right ear pain.  On physical exam, right TM is erythematous and bulging.  Physical exam findings are consistent with otitis media.  Patient was treated with amoxicillin.  Tylenol was recommended for discomfort.  She was advised to follow-up with primary care as needed.   ____________________________________________  FINAL CLINICAL IMPRESSION(S) / ED DIAGNOSES  Final diagnoses:  Acute suppurative otitis media of right ear without spontaneous rupture of tympanic membrane, recurrence not specified      NEW MEDICATIONS STARTED DURING THIS VISIT:  ED Discharge Orders         Ordered    amoxicillin (AMOXIL) 875 MG tablet  2 times daily     05/07/18 2140              This chart was dictated using voice recognition software/Dragon. Despite best efforts to proofread, errors can occur which can change the meaning. Any change was purely unintentional.    Orvil FeilWoods, Dejan Angert M, PA-C 05/07/18 2217    Jeanmarie PlantMcShane, James A,  MD 05/08/18 [phone]

## 2018-07-17 LAB — HM HIV SCREENING LAB: HM HIV Screening: NEGATIVE

## 2018-07-17 LAB — HM HEPATITIS C SCREENING LAB: HM Hepatitis Screen: NEGATIVE

## 2018-11-24 ENCOUNTER — Ambulatory Visit: Payer: Self-pay

## 2018-12-26 ENCOUNTER — Encounter: Payer: Self-pay | Admitting: Family Medicine

## 2018-12-26 ENCOUNTER — Ambulatory Visit: Payer: Self-pay | Admitting: Family Medicine

## 2018-12-26 ENCOUNTER — Other Ambulatory Visit: Payer: Self-pay

## 2018-12-26 DIAGNOSIS — Z113 Encounter for screening for infections with a predominantly sexual mode of transmission: Secondary | ICD-10-CM

## 2018-12-26 LAB — WET PREP FOR TRICH, YEAST, CLUE
Trichomonas Exam: NEGATIVE
Yeast Exam: NEGATIVE

## 2018-12-26 MED ORDER — THERA VITAL M PO TABS: 1.0000 | ORAL_TABLET | Freq: Every day | ORAL | 0 refills | Status: AC

## 2018-12-26 NOTE — Progress Notes (Signed)
Patient given MVI and pamphlet about planning a healthy pregnancy.Jenetta Downer, RN

## 2018-12-26 NOTE — Progress Notes (Signed)
Patient here for STD screening.Tommye Lehenbauer Brewer-Jensen, RN 

## 2018-12-26 NOTE — Progress Notes (Signed)
Patient wet mount reviewed, no treatment indicated at this time.Jenetta Downer, RN

## 2018-12-26 NOTE — Progress Notes (Signed)
    STI clinic/screening visit  Subjective:  Sawyer Twichell is a 28 y.o. female being seen today for an STI screening visit. The patient reports they do not have symptoms.  Patient has the following medical conditions:  There are no active problems to display for this patient.    Chief Complaint  Patient presents with  . Exposure to STD    HPI  Patient reports she is here for STD screen.  She has no sympts.  See flowsheet for further details and programmatic requirements.    The following portions of the patient's history were reviewed and updated as appropriate: allergies, current medications, past medical history, past social history, past surgical history and problem list.  Objective:  There were no vitals filed for this visit.  Physical Exam Constitutional:      Appearance: Normal appearance.  HENT:     Mouth/Throat:     Pharynx: Oropharynx is clear. No oropharyngeal exudate or posterior oropharyngeal erythema.  Neck:     Musculoskeletal: Neck supple. No muscular tenderness.  Abdominal:     Palpations: Abdomen is soft.     Tenderness: There is no abdominal tenderness.  Genitourinary:    General: Normal vulva.     Vagina: Vaginal discharge present.     Comments: sm amt of white disch, pH<4.5, no odor noted Lymphadenopathy:     Cervical: No cervical adenopathy.  Skin:    General: Skin is warm and dry.     Findings: No lesion or rash.    Assessment and Plan:  Aesha Schneck is a 28 y.o. female presenting to the [facility] Department for STI screening  1. Screening examination for venereal disease  - Multiple Vitamins-Minerals (MULTIVITAMIN) tablet; Take 1 tablet by mouth daily.  Dispense: 100 tablet; Refill: 0 - WET PREP FOR TRICH, YEAST, CLUE - Chlamydia/Gonorrhea [place] [facility] - HIV [place] [facility] - Syphilis Serology, [place] [facility]  Client denies sympts-no treatment today,   No follow-ups on file.  No future appointments.  Hassell Done, FNP

## 2019-08-31 ENCOUNTER — Ambulatory Visit: Payer: Medicaid Other

## 2019-09-04 ENCOUNTER — Other Ambulatory Visit: Payer: Self-pay

## 2019-09-04 ENCOUNTER — Ambulatory Visit: Payer: Self-pay | Admitting: Advanced Practice Midwife

## 2019-09-04 ENCOUNTER — Encounter: Payer: Self-pay | Admitting: Advanced Practice Midwife

## 2019-09-04 DIAGNOSIS — Z789 Other specified health status: Secondary | ICD-10-CM | POA: Insufficient documentation

## 2019-09-04 DIAGNOSIS — F1210 Cannabis abuse, uncomplicated: Secondary | ICD-10-CM

## 2019-09-04 DIAGNOSIS — F1090 Alcohol use, unspecified, uncomplicated: Secondary | ICD-10-CM | POA: Insufficient documentation

## 2019-09-04 DIAGNOSIS — Z7289 Other problems related to lifestyle: Secondary | ICD-10-CM

## 2019-09-04 DIAGNOSIS — Z113 Encounter for screening for infections with a predominantly sexual mode of transmission: Secondary | ICD-10-CM

## 2019-09-04 DIAGNOSIS — F339 Major depressive disorder, recurrent, unspecified: Secondary | ICD-10-CM

## 2019-09-04 DIAGNOSIS — F17200 Nicotine dependence, unspecified, uncomplicated: Secondary | ICD-10-CM

## 2019-09-04 NOTE — Progress Notes (Signed)
Wet mount reviewed and is negative today, so no treatment needed for wet mount per standing order. Quit line card given to pt per pt request. Kathreen Cosier, LCSW and cardinal cards with contact info given to pt per pt request and provider order. Provider orders completed.Lyman Speller, RN

## 2019-09-04 NOTE — Progress Notes (Signed)
Subjective:  Brittini Chaidez is a 29 y.o. SBF nullip smoker female being seen today for an STI screening visit. The patient reports they do not have symptoms.  Patient reports that they do not desire a pregnancy in the next year.   They reported they are not interested in discussing contraception today.  No LMP recorded (approximate).   Patient has the following medical conditions:   Patient Active Problem List   Diagnosis Date Noted  . Smoker 1/2 ppd 09/04/2019  . Marijuana abuse daily 09/04/2019  . Alcohol use (1 bottle Petrone) qoweekend last use 09/03/19 09/04/2019  . Depression dx'd 2016 09/04/2019    No chief complaint on file.   HPI  Patient reports LMP 08/18/19.  Last coitus yesterday without condom.  Smokes 1/2 ppd.  MJ daily.  Last ETOH last night (1 bottle Petrone)qo weekend.  Last pap 2019 neg.    Last HIV test per patient/review of record was 2019 Patient reports last pap was neg.   See flowsheet for further details and programmatic requirements.    The following portions of the patient's history were reviewed and updated as appropriate: allergies, current medications, past medical history, past social history, past surgical history and problem list.  Objective:  There were no vitals filed for this visit.  Physical Exam Vitals and nursing note reviewed.  Constitutional:      Appearance: Normal appearance.  HENT:     Head: Normocephalic and atraumatic.     Mouth/Throat:     Mouth: Mucous membranes are moist.     Pharynx: Oropharynx is clear. No oropharyngeal exudate or posterior oropharyngeal erythema.  Eyes:     Conjunctiva/sclera: Conjunctivae normal.  Pulmonary:     Effort: Pulmonary effort is normal.  Abdominal:     General: Abdomen is flat.     Palpations: Abdomen is soft. There is no mass.     Tenderness: There is no abdominal tenderness. There is no rebound.     Comments: Good tone, soft without tenderness  Genitourinary:    General: Normal vulva.    Exam position: Lithotomy position.     Pubic Area: No rash or pubic lice.      Labia:        Right: No rash or lesion.        Left: No rash or lesion.      Vagina: Vaginal discharge (small amt creamy white leukorrhea, ph<4.5) present. No erythema, bleeding or lesions.     Cervix: Normal.     Uterus: Normal.      Adnexa: Right adnexa normal and left adnexa normal.     Rectum: Normal.  Lymphadenopathy:     Head:     Right side of head: No preauricular or posterior auricular adenopathy.     Left side of head: No preauricular or posterior auricular adenopathy.     Cervical: No cervical adenopathy.     Upper Body:     Right upper body: No supraclavicular or axillary adenopathy.     Left upper body: No supraclavicular or axillary adenopathy.     Lower Body: No right inguinal adenopathy. No left inguinal adenopathy.  Skin:    General: Skin is warm and dry.     Findings: No rash.  Neurological:     Mental Status: She is alert and oriented to person, place, and time.      Assessment and Plan:  Lyanna Schiller is a 29 y.o. female presenting to the [facility] Department for STI screening  1. Smoker 1/2 ppd Counseled via 5 A's to stop smoking  2. Marijuana abuse daily   3. Screening examination for venereal disease Treat wet mount per standing orders Immunization nurse consulty - WET PREP FOR TRICH, YEAST, CLUE - Syphilis Serology, [facility] - HIV/HCV [place] [place] [facility] - Chlamydia/Gonorrhea [place] [facility]  4. Alcohol use (1 bottle Petrone) qoweekend last use 09/03/19 Counseled not to drink  5. Depression dx'd 2016      Return if symptoms worsen or fail to improve.  No future appointments.  Alberteen Spindle, CNM

## 2019-09-05 LAB — WET PREP FOR TRICH, YEAST, CLUE
Trichomonas Exam: NEGATIVE
Yeast Exam: NEGATIVE

## 2019-09-12 LAB — HM HIV SCREENING LAB: HM HIV Screening: NEGATIVE

## 2019-09-12 LAB — HM HEPATITIS C SCREENING LAB: HM Hepatitis Screen: NEGATIVE

## 2019-09-14 ENCOUNTER — Encounter: Payer: Self-pay | Admitting: Family Medicine

## 2019-09-20 ENCOUNTER — Ambulatory Visit: Payer: Medicaid Other | Admitting: Licensed Clinical Social Worker

## 2019-09-20 ENCOUNTER — Encounter: Payer: Self-pay | Admitting: Licensed Clinical Social Worker

## 2019-09-20 ENCOUNTER — Other Ambulatory Visit: Payer: Self-pay

## 2019-09-20 DIAGNOSIS — F331 Major depressive disorder, recurrent, moderate: Secondary | ICD-10-CM

## 2019-09-20 DIAGNOSIS — F411 Generalized anxiety disorder: Secondary | ICD-10-CM

## 2019-09-20 NOTE — Progress Notes (Signed)
Counselor Initial Adult Exam  Name: Courtney Byrd Date: 09/20/2019 MRN: [id number] DOB: [birth year] PCP: Patient, No Pcp Per  Time spent: 70 minutes   A biopsychosocial was completed on the Patient. Background information and current concerns were obtained during an intake in the office with the [facility] Department clinician, Glori Bickers, LCSW. Contact information and confidentiality was discussed and appropriate consents were signed.     Reason for Visit /Presenting Problem: Patient presents with concerns of depression, mood swings and anxiety. She reports that these symptoms are chronic and has experienced them since childhood. She reports that she was diagnosed with depression in 2015 when she presented at the ED wanting to speak with someone - she reports that she was hospitalized for 3 days but denies that she was suicidal at that time. Patient further reports that she never followed up to get help but is now because she wants her life to be in a different place. Currently she describes depressed mood, irritability at times, anhedonia, appetite disturbance, and feelings of guilt and failure. And she endorsees significant anxiety symptoms. She reports that these symptoms have been exacerbated due to recent break up/seperation with on and off boyfriend of 7 years and because she is working from home. There does not appear to be a clear history of mania or psychosis. She denies history of trauma and/or abuse/neglect. In addition, she denies active thoughts of intentional self-harm or passive wishes for death. She reports current stressors, including  family conflict, not feeling like her mom has provided her with any guidance, being in a caring/mother role of her siblings as well as her mother, daily use of marijuana and previous occasional use of cocaine and other pills (last use of cocaine or pills - two months ago), and not being where she would like to be in her life.   Mental Status  Exam:   Appearance:   Casual     Behavior:  Appropriate and Sharing  Motor:  Normal  Speech/Language:   Normal Rate  Affect:  Tearful  Mood:  dysthymic  Thought process:  normal  Thought content:    WNL  Sensory/Perceptual disturbances:    WNL  Orientation:  oriented to person, place, time/date and situation  Attention:  Good  Concentration:  Good  Memory:  WNL  Fund of knowledge:   Good  Insight:    Good  Judgment:   Good  Impulse Control:  Good   Reported Symptoms:  Obsessive thinking, Anhedonia, Sleep disturbance, Appetite disturbance, Fatigue, Lack of motivation and depressed mood; anxiety, anxious thoughts and worries, irritability  Risk Assessment: Danger to Self:  No Self-injurious Behavior: No Danger to Others: No Duty to Warn:no Physical Aggression / Violence:No  Access to Firearms a concern: No  Gang Involvement:No  Patient / guardian was educated about steps to take if suicide or homicide risk level increases between visits: yes While future psychiatric events cannot be accurately predicted, the patient does not currently require acute inpatient psychiatric care and does not currently meet [facility] involuntary commitment criteria.  Substance Abuse History: Current substance abuse: not currently. history of of substance use 6 years - last use 2 months ago; continues to smoke marijuana     Past Psychiatric History:   Previous psychological history is significant for depression Outpatient Providers:NA History of Psych Hospitalization: Yes once in 2015 for depression   Abuse History: Victim of No. Report needed: No. Victim of Neglect:No. Perpetrator of NA  Witness / Exposure to Domestic  Violence: No   Protective Services Involvement: No  Witness to Community Violence:  No   Family History: History reviewed. No pertinent family history.  Social History:  Social History   Socioeconomic History  . Marital status: Single    Spouse name: na  . Number  of children: 0  . Years of education: 53  . Highest education level: 11th grade  Occupational History  . Not on file  Tobacco Use  . Smoking status: Current Every Day Smoker    Packs/day: 0.33    Years: 6.00    Pack years: 1.98  . Smokeless tobacco: Never Used  Substance and Sexual Activity  . Alcohol use: Yes    Comment: rare use  . Drug use: Yes    Types: Marijuana    Comment: uses daily  . Sexual activity: Yes    Birth control/protection: None  Other Topics Concern  . Not on file  Social History Narrative   Patient has her own place and works form home full-time for [employer]. She reports challenges with her family dynamics. However, she does report that she has one best friend whom is very supportive of her/    Social Determinants of Health   Financial Resource Strain:   . Difficulty of Paying Living Expenses:   Food Insecurity:   . Worried About Programme researcher, broadcasting/film/video in the Last Year:   . Barista in the Last Year:   Transportation Needs:   . Freight forwarder (Medical):   Marland Kitchen Lack of Transportation (Non-Medical):   Physical Activity:   . Days of Exercise per Week:   . Minutes of Exercise per Session:   Stress:   . Feeling of Stress :   Social Connections:   . Frequency of Communication with Friends and Family:   . Frequency of Social Gatherings with Friends and Family:   . Attends Religious Services:   . Active Member of Clubs or Organizations:   . Attends Banker Meetings:   Marland Kitchen Marital Status:    Living situation: the patient lives alone  Sexual Orientation:  Straight  Relationship Status: single  Name of spouse / other:NA              If a parent, number of children / ages:NA   Support Systems; One best friend   Surveyor, quantity Stress:  No   Income/Employment/Disability: Employment  Financial planner: No   Educational History: Education: 11th grade  Religion/Sprituality/World View:   NA  Any cultural differences that may  affect / interfere with treatment:  not applicable   Recreation/Hobbies: bowling, cooking, play pool   Stressors:Other: trying to find herself   Strengths:  has a best friend. Stopped using cocaine and pills two months ago   Barriers:  None known at this time.   Legal History: Pending legal issue / charges: NA. History of legal issue / charges: NA  Medical History/Surgical History:reviewed History reviewed. No pertinent past medical history.  Past Surgical History:  Procedure Laterality Date  . ABDOMINAL SURGERY      Medications: Current Outpatient Medications  Medication Sig Dispense Refill  . brompheniramine-pseudoephedrine-DM 30-2-10 MG/5ML syrup Take 5 mLs by mouth 4 (four) times daily as needed. (Patient not taking: Reported on 12/26/2018) 120 mL 0  . cetirizine (ZYRTEC) 10 MG tablet Take 1 tablet (10 mg total) by mouth daily. (Patient not taking: Reported on 12/26/2018) 30 tablet 0  . Diphenhyd-Hydrocort-Nystatin (FIRST-DUKES MOUTHWASH) SUSP Use as directed 10 mLs in  the mouth or throat 4 (four) times daily. (Patient not taking: Reported on 12/26/2018) 237 mL 0  . fluticasone (FLONASE) 50 MCG/ACT nasal spray Place 1 spray into both nostrils daily. 1 g 0  . ibuprofen (ADVIL,MOTRIN) 800 MG tablet Take 1 tablet (800 mg total) by mouth every 8 (eight) hours as needed. (Patient not taking: Reported on 12/26/2018) 30 tablet 0  . Multiple Vitamins-Minerals (MULTIVITAMIN) tablet Take 1 tablet by mouth daily. 100 tablet 0   No current facility-administered medications for this visit.    Allergies  Allergen Reactions  . Percocet [Oxycodone-Acetaminophen] Nausea And Vomiting and Other (See Comments)    Flushed/hot   Annelyse Janes is a 29 y.o. year old female  with a reported history of mental health diagnoses of depression. Patient currently presents with continued depression symptoms, mood instability and anxiety symptoms that appear to be chronic. She describes significant depressive  symptoms, including depressed mood, anhedonia, sleep disturbance - increased need for sleep, low energy, over eating, feeling like a failure, difficulties concentrating, and restlessness/fidgity. (PHQ-9 = 23). Patient also describes significant anxiety symptoms (GAD-7 = 21).  She further reports daily marijuana use to manage symptoms. Patient denies any  suicidal ideation, homicidal ideation, manic symptom, hallucinations or psychotic thought patterns. Patient reports that these symptoms significantly impact her functioning in multiple life domains.   Due to the above symptoms and patient's reported history, patient is diagnosed with Major Depressive Disorder, recurrent episode, Moderate and Generalized Anxiety Disorder. Patient's mood symptoms should continue to be monitored closely to provide further diagnosis clarification. Continued mental health treatment is needed to address patient's symptoms and monitor her safety and stability. Patient is recommended for continued outpatient therapy to  reduce her symptoms and improve her coping strategies. If patient's symptoms do not decrease after a few sessions, patient will be recommended for a  psychiatric medication management evaluation.   There is no acute risk for suicide or violence at this time.  While future psychiatric events cannot be accurately predicted, the patient does not require acute inpatient psychiatric care and does not currently meet [facility] involuntary commitment criteria.  Diagnoses:    ICD-10-CM   1. Major depressive disorder, recurrent episode, moderate (HCC)  F33.1   2. Generalized anxiety disorder  F41.1     Plan of Care:  Patient's goal of treatment is to get clarity for herself, help with mental stress relief, coaching/mentoring to figure out what she wants to do with her life, and to learn how to move on.  LCSW provided brief psychoeducation on CBTs.  LCSW began discussing the potential need for medication  management evaluation.     Future Appointments  Date Time Provider Department Center  09/27/2019  4:00 PM Kathreen Cosier, LCSW [facility] None    Interpreter used: NA  Kathreen Cosier, LCSW

## 2019-09-27 ENCOUNTER — Ambulatory Visit: Payer: Self-pay | Admitting: Licensed Clinical Social Worker

## 2019-09-27 NOTE — Progress Notes (Incomplete)
Counselor/Therapist Progress Note  Patient ID: Courtney Byrd, MRN: [id number],    Date: 09/27/2019  Time Spent: ***   Treatment Type: Individual Therapy  Reported Symptoms: {CHL AMB Reported [phone]}  Mental Status Exam:  Appearance:   {PSY:22683}     Behavior:  {PSY:21022743}  Motor:  {PSY:22302}  Speech/Language:   {PSY:22685}  Affect:  {PSY:22687}  Mood:  {PSY:31886}  Thought process:  {PSY:31888}  Thought content:    {PSY:209-397-1196}  Sensory/Perceptual disturbances:    {[phone]}  Orientation:  {PSY:30297}  Attention:  {PSY:22877}  Concentration:  {PSY:540-475-8952}  Memory:  {[phone]}  Fund of knowledge:   {PSY:540-475-8952}  Insight:    {PSY:540-475-8952}  Judgment:   {PSY:540-475-8952}  Impulse Control:  {PSY:540-475-8952}   Risk Assessment: Danger to Self:  No Self-injurious Behavior: No Danger to Others: No Duty to Warn:no Physical Aggression / Violence:No  Access to Firearms a concern: No  Gang Involvement:No   Subjective: Patient was engaged and cooperative throughout the session using time effectively to discuss   Patient voices continued motivation for treatment and understanding of  . Patient is likely to benefit from future treatment because  remains motivated to decrease  And   and reports benefit of regular sessions in addressing these symptoms.   Interventions: Cognitive Behavioral Therapy   Checked in with patient and reviewed previous session, including assessment and goal of treatment. Reviewed CBTs. Explored patient's goal of treatment and worked collaboratively to develop CBT treatment plan. Provided support through active listening, validation of feelings, and highlighted patient's strengths.  Diagnosis:No diagnosis found.  Plan: Patient's goal of treatment is to get clarity for herself, help with mental stress relief, coaching/mentoring to figure out what she wants to do with her life, and to learn how to move on.    Future  Appointments  Date Time Provider Department Center  09/27/2019  4:00 PM Kathreen Cosier, LCSW [facility] None    Interpreter used: NA  Kathreen Cosier, LCSW

## 2019-10-17 ENCOUNTER — Ambulatory Visit: Payer: Medicaid Other | Admitting: Licensed Clinical Social Worker

## 2019-10-17 DIAGNOSIS — F411 Generalized anxiety disorder: Secondary | ICD-10-CM

## 2019-10-17 DIAGNOSIS — F39 Unspecified mood [affective] disorder: Secondary | ICD-10-CM

## 2019-10-17 NOTE — Progress Notes (Signed)
Counselor/Therapist Progress Note  Patient ID: Courtney Byrd, MRN: [id number],    Date: 10/17/2019  Time Spent: 60 minutes   Treatment Type: Individual Therapy  Reported Symptoms: Obsessive thinking and hypomanic symptoms- racing thoughts, feeling on edge, increased motivation, happier than usual, increased energy, activity, confidence; need for less sleep  Mental Status Exam:  Appearance:   Casual     Behavior:  Appropriate and Sharing  Motor:  Normal  Speech/Language:   Normal Rate  Affect:  Appropriate, Congruent and Tearful  Mood:  euthymic  Thought process:  normal  Thought content:    WNL  Sensory/Perceptual disturbances:    WNL  Orientation:  oriented to person, place, time/date, situation and day of week  Attention:  Good  Concentration:  Good  Memory:  WNL  Fund of knowledge:   Good  Insight:    Good  Judgment:   Good  Impulse Control:  Good   Risk Assessment: Danger to Self:  No Self-injurious Behavior: No Danger to Others: No Duty to Warn:no Physical Aggression / Violence:No  Access to Firearms a concern: No  Gang Involvement:No   Subjective: Patient was engaged and cooperative throughout the session using time effectively to discuss thoughts, feelings, and treatment plan. Patient voices continued motivation for treatment and understanding of mood and anxiety issues. Patient is likely to benefit from future treatment because she is motivated to decrease symptoms and improve functioning and reports benefit from sessions. Patient is also recommended for psychiatric medication evaluation due to hypomanic symptoms.   Interventions: Cognitive Behavioral Therapy   Checked in with patient and assessed current symptoms, reviewed previous session, including assessment and goal of treatment. Reviewed CBTs. Explored patient's goal of treatment and worked collaboratively to develop CBT treatment plan. Provided psychoeducation on hypo mania and mania and discussed bipolar  disorder type II.  Provided contact information for patient to follow up with [facility] for medication evaluation due to current presence of hypomanic symptoms. Provided psychoeducation on Emotions, Thoughts, Physical Reactions, and Behaviors. Provided support through active listening, validation of feelings, and highlighted patient's strengths.  Diagnosis:   ICD-10-CM   1. Generalized anxiety disorder  F41.1   2. Episodic mood disorder (HCC)  F39     Plan: Patient to f/u with [facility] before out next session.   Patient's goal of treatment is to get clarity for herself, help with mental stress relief, coaching/mentoring to figure out what she wants to do with her life, and to learn how to move on. Patient desires to stop using marijuana; Be evaluated for med management; and Finish GED. Treatment plan will be amended when patient is ready to stop using marijuana.   Treatment Target: Understand the relationship between thoughts, emotions, and behaviors  - Psychoeducation on CBT model   - Teach the connection between thoughts, emotions, and behaviors   Treatment Target: Increase realistic balanced thinking -to learn how to replace thinking with thoughts that are more accurate or helpful . Explore patient's thoughts, beliefs, automatic thoughts, assumptions  . Identify hot thoughts (upsetting ideas, self-talk and mental images) . Process distress and allow for emotional release  . Cognitive reframing  . Questioning and challenging thoughts  Treatment Target: Reducing vulnerability to "emotional mind" . Values clarification  - identify values, goals  . Self-care - nutrition, sleep, exercise  . Increase positive events  o Activity planning   Treatment Target: Increase coping skills . STOP technique . Introduce Mindfulness  . Teach mindfulness-  focus on awareness of thoughts and feelings  without attachment or judgment . Practice Mindfulness/acceptance meditation for anxiety/worry/ruminating  thoughts  Future Appointments  Date Time Provider Department Center  11/08/2019  9:00 AM Kathreen Cosier, LCSW [facility] None   Interpreter used: NA  Kathreen Cosier, LCSW

## 2019-10-18 ENCOUNTER — Ambulatory Visit: Payer: Medicaid Other | Admitting: Licensed Clinical Social Worker

## 2019-11-08 ENCOUNTER — Ambulatory Visit: Payer: Medicaid Other | Admitting: Licensed Clinical Social Worker

## 2019-11-08 DIAGNOSIS — F411 Generalized anxiety disorder: Secondary | ICD-10-CM

## 2019-11-08 DIAGNOSIS — F331 Major depressive disorder, recurrent, moderate: Secondary | ICD-10-CM

## 2019-11-08 NOTE — Progress Notes (Signed)
Counselor/Therapist Progress Note  Patient ID: Courtney Byrd, MRN: [id number],    Date: 11/08/2019  Time Spent: 45 minutes   Treatment Type: Individual Therapy  Reported Symptoms: Obsessive thinking, Sleep disturbance and low energy, racing thoughts, mild mood instability  Mental Status Exam:  Appearance:   Casual     Behavior:  Appropriate and Sharing  Motor:  Normal  Speech/Language:   Normal Rate  Affect:  Appropriate and Congruent  Mood:  normal  Thought process:  normal  Thought content:    WNL  Sensory/Perceptual disturbances:    WNL  Orientation:  oriented to person, place, time/date and situation  Attention:  Good  Concentration:  Good  Memory:  WNL  Fund of knowledge:   Good  Insight:    Good  Judgment:   Good  Impulse Control:  Good   Risk Assessment: Danger to Self:  No Self-injurious Behavior: No Danger to Others: No Duty to Warn:no Physical Aggression / Violence:No  Access to Firearms a concern: No  Gang Involvement:No   Subjective: Patient was engaged and cooperative throughout the session using time effectively to discuss thoughts and feelings.  Patient voices continued motivation for treatment and agreement with treatment plan. Patient is likely to benefit from future treatment because she is motivated to decrease symptoms and reports benefit of regular sessions in addressing symptoms.    Interventions: Cognitive Behavioral Therapy  Established psychological safety. Checked in with patient regarding her week. Reviewed previous session regarding evaluation for medication management. Reviewed goal of treatment and treatment plan. Explored patients desire and readiness to stop mariajuana and cigarettes. Encouraged patient to track her use over the next week and then decrease by one for the following week- patient is in agreement. Provided support through active listening, validation of feelings, and highlighted patient's strengths.   Plan:  Patient's goal of  treatment is to get clarity for herself, help with mental stress relief, coaching/mentoring to figure out what she wants to do with her life, and to learn how to move on. Patient desires to stop using marijuana and cigarettes  Treatment Target: Understand the relationship between thoughts, emotions, and behaviors   Psychoeducation on CBT model    Teach the connection between thoughts, emotions, and behaviors   Treatment Target: Increase realistic balanced thinking -to learn how to replace thinking with thoughts that are more accurate or helpful  Explore patients thoughts, beliefs, automatic thoughts, assumptions   Identify hot thoughts(upsetting ideas, self-talk and mental images)  Process distress and allow for emotional release   Cognitive reframing   Questioning and challenging thoughts  Treatment Target: Reducing vulnerability to emotional mind  Values clarification  - identify values, goals   Self-care - nutrition, sleep, exercise   Teach distress tolerance techniques - what helps me   Treatment Target: Increase coping skills  STOP technique  Introduce Mindfulness   Teach mindfulness- focus on awareness of thoughts and feelings without attachment or judgment  Practice Mindfulness/acceptance meditation for anxiety/worry/ruminating thoughts  Treatment Target: Stop smoking marijuana and cigarettes - Identify reasons for wanting to quit  - Choose quit day - Contact quit line  - Develop quit plan    Future Appointments  Date Time Provider Department Center  11/21/2019  9:00 AM Kathreen Cosier, LCSW [facility] None   Interpreter used: NA  Kathreen Cosier, LCSW

## 2019-11-21 ENCOUNTER — Ambulatory Visit: Payer: Medicaid Other | Admitting: Licensed Clinical Social Worker

## 2019-12-04 ENCOUNTER — Ambulatory Visit: Payer: Medicaid Other | Admitting: Licensed Clinical Social Worker

## 2019-12-04 DIAGNOSIS — F411 Generalized anxiety disorder: Secondary | ICD-10-CM

## 2019-12-04 DIAGNOSIS — F331 Major depressive disorder, recurrent, moderate: Secondary | ICD-10-CM

## 2019-12-04 NOTE — Progress Notes (Signed)
Counselor/Therapist Progress Note  Patient ID: Courtney Byrd, MRN: [id number],    Date: 12/04/2019  Time Spent: 45 minutes   Treatment Type: Individual Therapy  Reported Symptoms: Obsessive thinking, Anhedonia and depressed mood; anxiety, anxious thought patterns, irritability   Mental Status Exam:  Appearance:   Casual and Fairly Groomed     Behavior:  Appropriate and Sharing  Motor:  Normal  Speech/Language:   Normal Rate  Affect:  Appropriate and Congruent  Mood:  euthymic  Thought process:  normal  Thought content:    WNL  Sensory/Perceptual disturbances:    WNL  Orientation:  oriented to person, place, time/date, situation and day of week  Attention:  Good  Concentration:  Good  Memory:  WNL  Fund of knowledge:   Good  Insight:    Good  Judgment:   Good  Impulse Control:  Good   Risk Assessment: Danger to Self:  No Self-injurious Behavior: No Danger to Others: No Duty to Warn:no Physical Aggression / Violence:No  Access to Firearms a concern: No  Gang Involvement:No   Subjective: Patient was engaged and cooperative throughout the session using time effectively to discuss thoughts, feelings, and coping skills. Patient voices continued motivation for treatment and understanding of mood and anxiety issues. Patient is likely to benefit from future treatment because she remains motivated to decrease mood and anxiety symptoms and reports benefit of regular sessions in addressing these symptoms.   Interventions: Cognitive Behavioral Therapy  Established psychological safety. Checked in with patient regarding her week. Reviewed previous session regarding tracking use of marijuana and discussed patient's progress with reducing use. Explored patient's self-care/emotional regulation strategies, including hobbies like cooking, working out, and listening to music. Provided Psychoeducation on mindfulness, engaged patient in mindfulness exercise, processed exercise, and contracted with  patient to complete daily. Shared information on Clear fear, and [employer]. Encouraged patient to download and use as homework task. Provided support through active listening, validation of feelings, and highlighted patient's strengths.    Diagnosis:   ICD-10-CM   1. Generalized anxiety disorder  F41.1   2. Major depressive disorder, recurrent episode, moderate (HCC)  F33.1    Plan: Check in on mindfulness - Clear fear and [employer] app Patient's goal of treatment is to get clarity for herself, help with mental stress relief, coaching/mentoring to figure out what she wants to do with her life, and to learn how to move on. Patient desires to stop using marijuana and cigarettes  Treatment Target: Understand the relationship between thoughts, emotions, and behaviors   Psychoeducation on CBT model   Teach the connection between thoughts, emotions, and behaviors   Treatment Target: Increase realistic balanced thinking -to learn how to replace thinking with thoughts that are more accurate or helpful  Explore patient's thoughts, beliefs, automatic thoughts, assumptions   Identify hot thoughts(upsetting ideas, self-talk and mental images)  Process distress and allow for emotional release   Cognitive reframing   Questioning and challenging thoughts  Treatment Target: Reducing vulnerability to "emotional mind"  Values clarification - identify values, goals   Self-care -nutrition, sleep, exercise   Teach distress tolerance techniques - "what helps me"   Treatment Target: Increase coping skills  STOP technique  Introduce Mindfulness   Teach mindfulness- focus on awareness of thoughts and feelings without attachment or judgment  Practice Mindfulness/acceptance meditation for anxiety/worry/ruminating thoughts  Treatment Target: Stop smoking marijuana and cigarettes  Identify reasons for wanting to quit   Choose quit day  Contact quit line   Develop  quit plan    Future  Appointments  Date Time Provider Department Center  12/11/2019  4:40 PM Kathreen Cosier, LCSW [facility] None    Interpreter used: NA  Kathreen Cosier, LCSW

## 2019-12-11 ENCOUNTER — Ambulatory Visit: Payer: Medicaid Other | Admitting: Licensed Clinical Social Worker

## 2019-12-11 DIAGNOSIS — F331 Major depressive disorder, recurrent, moderate: Secondary | ICD-10-CM

## 2019-12-11 DIAGNOSIS — F411 Generalized anxiety disorder: Secondary | ICD-10-CM

## 2019-12-11 NOTE — Progress Notes (Signed)
Counselor/Therapist Progress Note  Patient ID: Courtney Byrd, MRN: [id number],    Date: 12/11/2019  Time Spent: 50 minutes  Treatment Type: Individual Therapy  Reported Symptoms: Sleep disturbance and anxiety, anxious thoughts   Mental Status Exam:  Appearance:   Casual and Well Groomed     Behavior:  Appropriate and Sharing  Motor:  Normal  Speech/Language:   Normal Rate  Affect:  Appropriate and Congruent  Mood:  euthymic  Thought process:  normal  Thought content:    WNL  Sensory/Perceptual disturbances:    WNL  Orientation:  oriented to person, place, time/date, situation and day of week  Attention:  Good  Concentration:  Good  Memory:  WNL  Fund of knowledge:   Good  Insight:    Good  Judgment:   Good  Impulse Control:  Good   Risk Assessment: Danger to Self:  No Self-injurious Behavior: No Danger to Others: No Duty to Warn:no Physical Aggression / Violence:No  Access to Firearms a concern: No  Gang Involvement:No   Subjective: Patient was engaged and cooperative throughout the session using time effectively to discuss thoughts and feelings. Patient voices continued motivation for treatment, understanding of mood and anxiety issues and stopped using MJ 3 days ago. Patient is likely to benefit from future treatment because she remains motivated to decrease depression and anxiety and reports benefit of regular sessions in addressing these symptoms.    Interventions: Cognitive Behavioral Therapy  Established psychological safety. Checked in with patient regarding her week. Reviewed previous session regarding stopping use of MJ and use of mindfulness. Taught patient use of 5 senses as a mindfulness exercise. Discussed sleep hygiene. Provided support through active listening, validation of feelings, and highlighted patient's strengths.   Diagnosis:   ICD-10-CM   1. Generalized anxiety disorder  F41.1   2. Major depressive disorder, recurrent episode, moderate (HCC)  F33.1      Plan: Patient's goal of treatment is to get clarity for herself, help with mental stress relief, coaching/mentoring to figure out what she wants to do with her life, and to learn how to move on. Patient desires to stop using marijuanaand cigarettes  Treatment Target: Understand the relationship between thoughts, emotions, and behaviors   Psychoeducation on CBT model   Teach the connection between thoughts, emotions, and behaviors   Treatment Target: Increase realistic balanced thinking -to learn how to replace thinking with thoughts that are more accurate or helpful  Explore patient's thoughts, beliefs, automatic thoughts, assumptions   Identify hot thoughts(upsetting ideas, self-talk and mental images)  Process distress and allow for emotional release   Cognitive reframing   Questioning and challenging thoughts  Treatment Target: Reducing vulnerability to "emotional mind"  Values clarification - identify values, goals   Self-care -nutrition, sleep, exercise  Teach distress tolerance techniques -"what helps me"  Treatment Target: Increase coping skills  STOP technique  Introduce Mindfulness   Teach mindfulness- focus on awareness of thoughts and feelings without attachment or judgment  Practice Mindfulness/acceptance meditation for anxiety/worry/ruminating thoughts  Treatment Target: remain quit from smoking marijuana   Relapse prevention    Future Appointments  Date Time Provider Department Center  12/25/2019  8:30 AM Kathreen Cosier, LCSW [facility] None    Interpreter used: NA   Kathreen Cosier, LCSW

## 2019-12-14 ENCOUNTER — Other Ambulatory Visit: Payer: Self-pay

## 2019-12-14 ENCOUNTER — Ambulatory Visit: Payer: Self-pay | Admitting: Family Medicine

## 2019-12-14 DIAGNOSIS — Z113 Encounter for screening for infections with a predominantly sexual mode of transmission: Secondary | ICD-10-CM

## 2019-12-14 LAB — WET PREP FOR TRICH, YEAST, CLUE
Trichomonas Exam: NEGATIVE
Yeast Exam: NEGATIVE

## 2019-12-14 NOTE — Progress Notes (Signed)
[facility] Department STI clinic/screening visit  Subjective:  Courtney Byrd is a 29 y.o. female being seen today for an STI screening visit. The patient reports they do have symptoms.  Patient reports that they do not desire a pregnancy in the next year.   They reported they are not interested in discussing contraception today.  Patient's last menstrual period was 12/03/2019 (exact date).   Patient has the following medical conditions:   Patient Active Problem List   Diagnosis Date Noted  . Smoker 1/2 ppd 09/04/2019  . Marijuana abuse daily 09/04/2019  . Alcohol use (1 bottle Petrone) qoweekend last use 09/03/19 09/04/2019  . Depression dx'd 2016 09/04/2019    Chief Complaint  Patient presents with  . SEXUALLY TRANSMITTED DISEASE    screening     HPI  Patient reports symptoms of genital itching   Last HIV test per patient/review of record was 09/04/2019 Patient reports last pap was "about a year ago"  See flowsheet for further details and programmatic requirements.    The following portions of the patient's history were reviewed and updated as appropriate: allergies, current medications, past medical history, past social history, past surgical history and problem list.  Objective:  There were no vitals filed for this visit.  Physical Exam Constitutional:      Appearance: Normal appearance.  HENT:     Head: Normocephalic.     Mouth/Throat:     Mouth: Mucous membranes are moist.     Pharynx: Oropharynx is clear. No oropharyngeal exudate or posterior oropharyngeal erythema.  Abdominal:     General: Abdomen is flat.     Palpations: Abdomen is soft. There is no mass.     Tenderness: There is no abdominal tenderness. There is no guarding.  Genitourinary:    Comments: External genitalia without, lice, nits, erythema, edema , lesions or inguinal adenopathy. Vagina with normal mucosa and discharge and pH equals 4.  Cervix without visual lesions, uterus firm,  mobile, non-tender, no masses, CMT adnexal fullness or tenderness. hyperpigmented area in crease of thigh  Musculoskeletal:     Cervical back: Normal range of motion and neck supple.  Lymphadenopathy:     Cervical: No cervical adenopathy.  Skin:    General: Skin is warm and dry.     Findings: No bruising, erythema or lesion.  Neurological:     Mental Status: She is alert.  Psychiatric:        Mood and Affect: Mood normal.        Behavior: Behavior normal.      Assessment and Plan:  Courtney Byrd is a 29 y.o. female presenting to the [facility] Department for STI screening  1. Screening examination for venereal disease  - Chlamydia/Gonorrhea [facility] throat - HIV/HCV [place] [facility] - WET PREP FOR TRICH, YEAST, CLUE - HBV Antigen/Antibody [facility] - Chlamydia/Gonorrhea [facility] vaginal  - Chlamydia/Gonorrhea [facility] rectal   Patient accepted all screenings including oral, vaginal C and rectal CT/GC and bloodwork for HIV/RPR, HCV and HBV.    Patient meets criteria for HepB screening? Yes. Ordered? Yes Patient meets criteria for HepC screening? Yes. Ordered? Yes  Per wet prep no treatment needed.  Discussed with patient about friction from panties and also changing panties more frequently when sweating.    Discussed time line for [facility] results and that patient will be called with positive results and encouraged patient to call if she had not heard in 2 weeks.  Counseled to return or seek care for continued or worsening symptoms Recommended condom use with all sex  Patient is currently using condoms to prevent pregnancy.     No follow-ups on file.  Future Appointments  Date Time Provider Department Center  12/25/2019  8:30 AM Kathreen Cosier, LCSW [facility] None    Wendi Snipes, RN

## 2019-12-14 NOTE — Progress Notes (Signed)
Patient here for STD screen. Patient reports some vaginal itch. Condoms given.   Harvie Heck, RN

## 2019-12-15 NOTE — Progress Notes (Signed)
Reviewed ERRN documentation in flowsheet and exam note.  Agree that documentation meets STD program requirements/guidelines and standing orders.  Will sign since [facility] not cleared to sign documentation yet.

## 2019-12-21 ENCOUNTER — Telehealth: Payer: Self-pay | Admitting: Family Medicine

## 2019-12-21 LAB — HM HIV SCREENING LAB: HM HIV Screening: NEGATIVE

## 2019-12-21 LAB — HM HEPATITIS C SCREENING LAB: HM Hepatitis Screen: NEGATIVE

## 2019-12-21 LAB — HEPATITIS B SURFACE ANTIGEN: Hepatitis B Surface Ag: NONREACTIVE

## 2019-12-21 NOTE — Telephone Encounter (Signed)
Call to patient to discuss positive TR.  Patient verified by password.  Patient informed of + Chlamydia  result.  Patient schedueld for treatment appointment.  Patient verbalizes understanding at this time and has no further questions. Rune Mendez, RN  

## 2019-12-24 ENCOUNTER — Other Ambulatory Visit: Payer: Self-pay

## 2019-12-24 ENCOUNTER — Encounter: Payer: Self-pay | Admitting: Family Medicine

## 2019-12-24 ENCOUNTER — Ambulatory Visit: Payer: Self-pay | Admitting: Family Medicine

## 2019-12-24 DIAGNOSIS — A749 Chlamydial infection, unspecified: Secondary | ICD-10-CM

## 2019-12-24 MED ORDER — AZITHROMYCIN 500 MG PO TABS
1000.0000 mg | ORAL_TABLET | Freq: Once | ORAL | Status: AC
  Administered 2019-12-24: 1000 mg via ORAL

## 2019-12-24 NOTE — Progress Notes (Signed)
Patient in clinic today for treatment of chlamydia.  Patient treated per standing order with Azithromycin 1000 mg PO once DOT in clinic.  Patient given instructions and verbalized understanding. Patient to cal if questions or concerns. Ormond Lazo, RN  

## 2019-12-25 ENCOUNTER — Ambulatory Visit: Payer: Medicaid Other | Admitting: Licensed Clinical Social Worker

## 2020-01-01 ENCOUNTER — Ambulatory Visit: Payer: Medicaid Other | Admitting: Licensed Clinical Social Worker

## 2020-01-01 DIAGNOSIS — F411 Generalized anxiety disorder: Secondary | ICD-10-CM

## 2020-01-01 DIAGNOSIS — F331 Major depressive disorder, recurrent, moderate: Secondary | ICD-10-CM

## 2020-01-01 NOTE — Progress Notes (Signed)
Counselor/Therapist Progress Note  Patient ID: Courtney Byrd, MRN: [id number],    Date: 01/01/2020  Time Spent: 20 minutes    Treatment Type: Individual Therapy  Reported Symptoms: anxiety, irritability, feeling overwhelmed, agitated, sleep disturbance  Mental Status Exam:  Appearance:   Casual     Behavior:  Appropriate and Sharing  Motor:  Normal  Speech/Language:   Normal Rate  Affect:  Appropriate and Congruent  Mood:  normal  Thought process:  normal  Thought content:    WNL  Sensory/Perceptual disturbances:    WNL  Orientation:  oriented to person, place, time/date, situation, day of week and month of year  Attention:  Good  Concentration:  Good  Memory:  WNL  Fund of knowledge:   Good  Insight:    Good  Judgment:   Good  Impulse Control:  Good   Risk Assessment: Danger to Self:  No Self-injurious Behavior: No Danger to Others: No Duty to Warn:no Physical Aggression / Violence:No  Access to Firearms a concern: No  Gang Involvement:No   Subjective: Patient was engaged and cooperative throughout the session using time effectively to discuss current symptoms, thoughts and feelings. Patient voices continued motivation for treatment and understanding of mood and anxiety issues. Patient is likely to benefit from future treatment because she remains motivated to decrease mood and anxiety symptoms and reports benefit of regular sessions in addressing these symptoms. Patient is also recommended for a psychiatric medication management evaluation.   Interventions: Cognitive Behavioral Therapy  Established psychological safety. Checked in with patient regarding her week. Explored patient's current symptoms and discussed patient's desire for medication management. Encouraged patient to follow up with PCP and informed her that the PCP may not prescribe her due to possible hypomanic symptoms. Briefly discussed relapse. Encouraged patient to continue to practice self-care (working  out), and to continue to use mindfulness strategies to manage anxiety symptoms. Provided support through active listening, validation of feelings, and highlighted patient's strengths.   Diagnosis:   ICD-10-CM   1. Generalized anxiety disorder  F41.1   2. Major depressive disorder, recurrent episode, moderate (HCC)  F33.1     Plan: Patient's goal of treatment is to get clarity for herself, help with mental stress relief, coaching/mentoring to figure out what she wants to do with her life, and to learn how to move on. Patient desires to stop using marijuanaand cigarettes  Treatment Target: Understand the relationship between thoughts, emotions, and behaviors   Psychoeducation on CBT model   Teach the connection between thoughts, emotions, and behaviors   Treatment Target: Increase realistic balanced thinking -to learn how to replace thinking with thoughts that are more accurate or helpful  Explore patient's thoughts, beliefs, automatic thoughts, assumptions   Identify hot thoughts(upsetting ideas, self-talk and mental images)  Process distress and allow for emotional release   Cognitive reframing   Questioning and challenging thoughts  Treatment Target: Reducing vulnerability to "emotional mind"  Values clarification - identify values, goals   Self-care -nutrition, sleep, exercise  Teach distress tolerance techniques -"what helps me"  Treatment Target: Increase coping skills  STOP technique  Introduce Mindfulness   Teach mindfulness- focus on awareness of thoughts and feelings without attachment or judgment  Practice Mindfulness/acceptance meditation for anxiety/worry/ruminating thoughts  Treatment Target: remain quit from smoking marijuana   Relapse prevention   Future Appointments  Date Time Provider Department Center  01/15/2020  2:00 PM Kathreen Cosier, LCSW [facility] None   Interpreter used: NA  Kathreen Cosier, LCSW

## 2020-01-02 NOTE — Addendum Note (Signed)
Addended by: Wendi Snipes on: 01/02/2020 03:17 PM   Modules accepted: Orders

## 2020-01-15 ENCOUNTER — Ambulatory Visit: Payer: Medicaid Other | Admitting: Licensed Clinical Social Worker

## 2020-01-22 ENCOUNTER — Ambulatory Visit: Payer: Medicaid Other | Admitting: Licensed Clinical Social Worker

## 2020-02-20 ENCOUNTER — Ambulatory Visit: Payer: Self-pay | Admitting: Physician Assistant

## 2020-02-20 ENCOUNTER — Ambulatory Visit: Payer: Medicaid Other

## 2020-02-20 ENCOUNTER — Other Ambulatory Visit: Payer: Self-pay

## 2020-02-20 DIAGNOSIS — Z299 Encounter for prophylactic measures, unspecified: Secondary | ICD-10-CM

## 2020-02-20 DIAGNOSIS — Z113 Encounter for screening for infections with a predominantly sexual mode of transmission: Secondary | ICD-10-CM

## 2020-02-20 LAB — WET PREP FOR TRICH, YEAST, CLUE
Trichomonas Exam: NEGATIVE
Yeast Exam: NEGATIVE

## 2020-02-20 MED ORDER — CLOTRIMAZOLE 1 % VA CREA: 1.0000 | TOPICAL_CREAM | Freq: Every day | VAGINAL | 0 refills | Status: AC

## 2020-02-20 NOTE — Progress Notes (Signed)
Wet mount reviewed by provider; dispensed Clotrimazole 1% vaginal cream per provider orders. Provider orders completed.

## 2020-02-21 ENCOUNTER — Encounter: Payer: Self-pay | Admitting: Physician Assistant

## 2020-02-21 NOTE — Progress Notes (Signed)
[facility] Department STI clinic/screening visit  Subjective:  Courtney Byrd is a 29 y.o. female being seen today for an STI screening visit. The patient reports they do have symptoms.  Patient reports that they do not desire a pregnancy in the next year.   They reported they are not interested in discussing contraception today.  Patient's last menstrual period was 02/01/2020.   Patient has the following medical conditions:   Patient Active Problem List   Diagnosis Date Noted  . Smoker 1/2 ppd 09/04/2019  . Marijuana abuse daily 09/04/2019  . Alcohol use (1 bottle Petrone) qoweekend last use 09/03/19 09/04/2019  . Depression dx'd 2016 09/04/2019  . Cocaine abuse (HCC) 07/25/2013  . Mood disorder (HCC) 07/25/2013    Chief Complaint  Patient presents with  . SEXUALLY TRANSMITTED DISEASE    screening    HPI  Patient reports that she has had internal and external itching for a few weeks.  Denies other symptoms, chronic conditions and regular medicines.  Reports last HIV test was 2 months ago and last pap was between 3 and 5 years ago.   See flowsheet for further details and programmatic requirements.    The following portions of the patient's history were reviewed and updated as appropriate: allergies, current medications, past medical history, past social history, past surgical history and problem list.  Objective:  There were no vitals filed for this visit.  Physical Exam Constitutional:      General: She is not in acute distress.    Appearance: Normal appearance.  HENT:     Head: Normocephalic and atraumatic.     Comments: No nits,lice, or hair loss. No cervical, supraclavicular or axillary adenopathy.    Mouth/Throat:     Mouth: Mucous membranes are moist.     Pharynx: Oropharynx is clear. No oropharyngeal exudate or posterior oropharyngeal erythema.  Eyes:     Conjunctiva/sclera: Conjunctivae normal.  Pulmonary:     Effort: Pulmonary effort is normal.   Abdominal:     Palpations: Abdomen is soft. There is no mass.     Tenderness: There is no abdominal tenderness. There is no guarding or rebound.  Genitourinary:    General: Normal vulva.     Rectum: Normal.     Comments: External genitalia/pubic area without nits, lice, edema, erythema, lesions and inguinal adenopathy. Vagina with normal mucosa and discharge. Cervix without visible lesions. Uterus firm, mobile, nt, no masses, no CMT, no adnexal tenderness or fullness. Musculoskeletal:     Cervical back: Neck supple. No tenderness.  Skin:    General: Skin is warm and dry.     Findings: No bruising, erythema, lesion or rash.  Neurological:     Mental Status: She is alert and oriented to person, place, and time.  Psychiatric:        Mood and Affect: Mood normal.        Behavior: Behavior normal.        Thought Content: Thought content normal.        Judgment: Judgment normal.      Assessment and Plan:  Courtney Byrd is a 29 y.o. female presenting to the [facility] Department for STI screening  1. Screening for STD (sexually transmitted disease) Patient into clinic with symptoms. Rec condoms with all sex. Await test results.  Counseled that RN will call if needs to RTC for treatment once results are back. - WET PREP FOR TRICH, YEAST, CLUE - Gonococcus culture - Chlamydia/Gonorrhea [place] [facility] -  HIV [facility] - Syphilis Serology, [place] [facility] - Gonococcus culture  2. Prophylactic measure Will treat with Clotrimazole 1% vaginal cream 1 app qhs for 7 days for symptom relief while awaiting other results. No sex for 10 days and until after results are back.  - clotrimazole (CLOTRIMAZOLE-7) 1 % vaginal cream; Place 1 Applicatorful vaginally at bedtime.  Dispense: 45 g; Refill: 0     No follow-ups on file.  Future Appointments  Date Time Provider Department Center  03/04/2020  9:00 AM Kathreen Cosier, LCSW [facility] None    Marylynn Pearson [place], [place]

## 2020-02-25 LAB — GONOCOCCUS CULTURE

## 2020-03-04 ENCOUNTER — Ambulatory Visit: Payer: Medicaid Other | Admitting: Licensed Clinical Social Worker

## 2020-03-18 ENCOUNTER — Ambulatory Visit: Payer: Medicaid Other | Admitting: Licensed Clinical Social Worker

## 2020-04-16 ENCOUNTER — Emergency Department
Admission: EM | Admit: 2020-04-16 | Discharge: 2020-04-16 | Disposition: A | Discharge status: Home / Self Care | Payer: HRSA Program | Attending: Emergency Medicine | Admitting: Emergency Medicine

## 2020-04-16 ENCOUNTER — Encounter: Payer: Self-pay | Admitting: Emergency Medicine

## 2020-04-16 ENCOUNTER — Other Ambulatory Visit: Payer: Self-pay

## 2020-04-16 DIAGNOSIS — J029 Acute pharyngitis, unspecified: Secondary | ICD-10-CM | POA: Diagnosis present

## 2020-04-16 DIAGNOSIS — F17200 Nicotine dependence, unspecified, uncomplicated: Secondary | ICD-10-CM | POA: Diagnosis not present

## 2020-04-16 DIAGNOSIS — U071 COVID-19: Secondary | ICD-10-CM | POA: Diagnosis not present

## 2020-04-16 LAB — SARS CORONAVIRUS 2 (TAT 6-24 HRS): SARS Coronavirus 2: POSITIVE — AB

## 2020-04-16 NOTE — ED Triage Notes (Signed)
C/O fever, runny nose, body aches. Wants a covid test.  AAOx3.  Skin warm and dry. NAD

## 2020-04-16 NOTE — ED Notes (Signed)
Sore throat this week. Headaches for a couple weeks.  Also says right ear hurts.

## 2020-04-16 NOTE — ED Provider Notes (Signed)
[facility] Emergency Department Provider Note   ____________________________________________    I have reviewed the triage vital signs and the nursing notes.   HISTORY  Chief Complaint Generalized Body Aches     HPI Courtney Byrd is a 30 y.o. female who presents with viral symptoms consistent with COVID-19 including sore throat headache, fatigue.  Has taken over-the-counter medications with little improvement.  Has not been tested.     History reviewed. No pertinent past medical history.  Patient Active Problem List   Diagnosis Date Noted  . Smoker 1/2 ppd 09/04/2019  . Marijuana abuse daily 09/04/2019  . Alcohol use (1 bottle Petrone) qoweekend last use 09/03/19 09/04/2019  . Depression dx'd 2016 09/04/2019  . Cocaine abuse (HCC) 07/25/2013  . Mood disorder (HCC) 07/25/2013    Past Surgical History:  Procedure Laterality Date  . ABDOMINAL SURGERY      Prior to Admission medications   Medication Sig Start Date End Date Taking? Authorizing Provider  brompheniramine-pseudoephedrine-DM 30-2-10 MG/5ML syrup Take 5 mLs by mouth 4 (four) times daily as needed. Patient not taking: Reported on 12/26/2018 04/10/16   Joni Reining, PA-C  cetirizine (ZYRTEC) 10 MG tablet Take 1 tablet (10 mg total) by mouth daily. Patient not taking: Reported on 12/26/2018 02/01/17   Little, Traci M, PA-C  clotrimazole (CLOTRIMAZOLE-7) 1 % vaginal cream Place 1 Applicatorful vaginally at bedtime. 02/20/20   Matt Holmes, PA  Diphenhyd-Hydrocort-Nystatin (FIRST-DUKES MOUTHWASH) SUSP Use as directed 10 mLs in the mouth or throat 4 (four) times daily. Patient not taking: Reported on 12/26/2018 04/10/16   Joni Reining, PA-C  fluticasone Tenita Cue [facility]) 50 MCG/ACT nasal spray Place 1 spray into both nostrils daily. 02/01/17 02/01/18  Little, Traci M, PA-C  ibuprofen (ADVIL,MOTRIN) 800 MG tablet Take 1 tablet (800 mg total) by mouth every 8 (eight) hours as needed. Patient not  taking: Reported on 12/26/2018 02/01/17   Little, Traci M, PA-C  Multiple Vitamins-Minerals (MULTIVITAMIN) tablet Take 1 tablet by mouth daily. 12/26/18   Larene Pickett, FNP     Allergies Percocet [oxycodone-acetaminophen]  No family history on file.  Social History Social History   Tobacco Use  . Smoking status: Current Every Day Smoker    Packs/day: 0.33    Years: 6.00    Pack years: 1.98  . Smokeless tobacco: Never Used  Vaping Use  . Vaping Use: Never used  Substance Use Topics  . Alcohol use: Yes    Alcohol/week: 2.0 standard drinks    Types: 2 Glasses of wine per week    Comment: per week   . Drug use: Not Currently    Types: Marijuana    Comment: quit on 12/03/2019    Review of Systems  Constitutional: Chills, fatigue  ENT: Sore throat   Gastrointestinal: No abdominal pain.    Musculoskeletal: Myalgias Skin: Negative for rash. Neurological: Headaches    ____________________________________________   PHYSICAL EXAM:  VITAL SIGNS: ED Triage Vitals  Enc Vitals Group     BP 04/16/20 1114 109/64     Pulse Rate 04/16/20 1114 79     Resp 04/16/20 1114 17     Temp 04/16/20 1114 98.8 F (37.1 C)     Temp Source 04/16/20 1114 Oral     SpO2 04/16/20 1114 99 %     Weight 04/16/20 1049 72.6 kg (160 lb 0.9 oz)     Height 04/16/20 1049 1.6 m (5\' 3" )     Head Circumference --  Peak Flow --      Pain Score 04/16/20 1048 0     Pain Loc --      Pain Edu? --      Excl. in GC? --     Constitutional: Alert and oriented. No acute distress. Pleasant and interactive Eyes: Conjunctivae are normal.  Head: Atraumatic. Nose: Mild congestion Mouth/Throat: Mucous membranes are moist.   Cardiovascular: Normal rate, regular rhythm.   Musculoskeletal: No lower extremity tenderness nor edema.   Neurologic:  Normal speech and language. No gross focal neurologic deficits are appreciated.   Skin:  Skin is warm, dry and intact. No rash  noted.   ____________________________________________   LABS (all labs ordered are listed, but only abnormal results are displayed)  Labs Reviewed  SARS CORONAVIRUS 2 (TAT 6-24 HRS)   ____________________________________________  EKG   ____________________________________________  RADIOLOGY  None ____________________________________________   PROCEDURES  Procedure(s) performed: No  Procedures   Critical Care performed: No ____________________________________________   INITIAL IMPRESSION / ASSESSMENT AND PLAN / ED COURSE  Pertinent labs & imaging results that were available during my care of the patient were reviewed by me and considered in my medical decision making (see chart for details).   Patient overall well-appearing and in no acute distress, symptoms consistent with COVID-19.  Vitals and exam are reassuring, recommend supportive care, outpatient follow-up, return precautions discussed   ____________________________________________   FINAL CLINICAL IMPRESSION(S) / ED DIAGNOSES  Final diagnoses:  Clinical diagnosis of COVID-19      NEW MEDICATIONS STARTED DURING THIS VISIT:  Discharge Medication List as of 04/16/2020 11:34 AM       Note:  This document was prepared using Dragon voice recognition software and may include unintentional dictation errors.    Jene Every, MD 04/16/20 [phone]

## 2020-04-17 ENCOUNTER — Other Ambulatory Visit: Payer: Self-pay | Admitting: Nurse Practitioner

## 2020-04-17 ENCOUNTER — Telehealth: Payer: Self-pay

## 2020-04-17 ENCOUNTER — Telehealth: Payer: Self-pay | Admitting: Nurse Practitioner

## 2020-04-17 DIAGNOSIS — U071 COVID-19: Secondary | ICD-10-CM

## 2020-04-17 NOTE — Telephone Encounter (Signed)
Patient notified of positive COVID-19 test results. Pt verbalized understanding. Pt reports symptoms of sore throat, headache. Criteria for self-isolation if you test positive for COVID-19, regardless of vaccination status:  -If you have mild symptoms that are resolving or have resolved, isolate at home for 5 days since symptoms started AND continue to wear a well-fitted mask when around others in the home and in public for 5 additional days after isolation is completed -If you have a fever and/or moderate to severe symptoms, isolate for at least 10 days since the symptoms started AND until you are fever free for at least 24 hours without the use of fever-reducing medications -If you tested positive and did not have symptoms, isolate for at least 5 days after your positive test  Use over-the-counter medications for symptoms.If you develop respiratory issues/distress, seek medical care in the Emergency Department.  If you must leave home or if you have to be around others please wear a mask. Please limit contact with immediate family members in the home, practice social distancing, frequent handwashing and clean hard surfaces touched frequently with household cleaning products. Members of your household will also need to quarantine and test.You may also be contacted by the health department for follow up. [facility] Department notified.

## 2020-04-17 NOTE — Progress Notes (Signed)
I connected by phone with Courtney Byrd on 04/17/2020 at 7:47 PM to discuss the potential use of a new treatment for mild to moderate COVID-19 viral infection in non-hospitalized patients.  This patient is a 30 y.o. female that meets the FDA criteria for Emergency Use Authorization of COVID monoclonal antibody sotrovimab.  Has a (+) direct SARS-CoV-2 viral test result  Has mild or moderate COVID-19   Is NOT hospitalized due to COVID-19  Is within 10 days of symptom onset  Has at least one of the high risk factor(s) for progression to severe COVID-19 and/or hospitalization as defined in EUA.  Specific high risk criteria : BMI > 25 and Other high risk medical condition per CDC:  unvaccinated, smoker, depression   I have spoken and communicated the following to the patient or parent/caregiver regarding COVID monoclonal antibody treatment:  1. FDA has authorized the emergency use for the treatment of mild to moderate COVID-19 in adults and pediatric patients with positive results of direct SARS-CoV-2 viral testing who are 63 years of age and older weighing at least 40 kg, and who are at high risk for progressing to severe COVID-19 and/or hospitalization.  2. The significant known and potential risks and benefits of COVID monoclonal antibody, and the extent to which such potential risks and benefits are unknown.  3. Information on available alternative treatments and the risks and benefits of those alternatives, including clinical trials.  4. Patients treated with COVID monoclonal antibody should continue to self-isolate and use infection control measures (e.g., wear mask, isolate, social distance, avoid sharing personal items, clean and disinfect "high touch" surfaces, and frequent handwashing) according to CDC guidelines.   5. The patient or parent/caregiver has the option to accept or refuse COVID monoclonal antibody treatment.  After reviewing this information with the patient, the patient  has agreed to receive one of the available covid 19 monoclonal antibodies and will be provided an appropriate fact sheet prior to infusion. [name], NP 04/17/2020 7:47 PM

## 2020-04-17 NOTE — Telephone Encounter (Signed)
Called to discuss with patient about COVID-19 symptoms and the use of one of the available treatments for those with mild to moderate Covid symptoms and at a high risk of hospitalization.  Pt appears to qualify for outpatient treatment due to co-morbid conditions and/or a member of an at-risk group in accordance with the FDA Emergency Use Authorization.    Symptom onset: 04/12/2020 Vaccinated: no Booster? no Immunocompromised? Yes- no vaccine Qualifiers: Smoker, BMI, depression, SVI  Courtney Byrd Courtney Byrd

## 2020-04-18 ENCOUNTER — Ambulatory Visit (HOSPITAL_COMMUNITY): Payer: Medicaid Other | Attending: Pulmonary Disease

## 2020-05-29 ENCOUNTER — Ambulatory Visit: Payer: Medicaid Other

## 2020-06-04 ENCOUNTER — Ambulatory Visit: Payer: Medicaid Other

## 2020-06-05 ENCOUNTER — Ambulatory Visit: Payer: Self-pay | Admitting: Physician Assistant

## 2020-06-05 ENCOUNTER — Encounter: Payer: Self-pay | Admitting: Physician Assistant

## 2020-06-05 ENCOUNTER — Other Ambulatory Visit: Payer: Self-pay

## 2020-06-05 DIAGNOSIS — N760 Acute vaginitis: Secondary | ICD-10-CM

## 2020-06-05 DIAGNOSIS — Z113 Encounter for screening for infections with a predominantly sexual mode of transmission: Secondary | ICD-10-CM

## 2020-06-05 DIAGNOSIS — B9689 Other specified bacterial agents as the cause of diseases classified elsewhere: Secondary | ICD-10-CM

## 2020-06-05 LAB — WET PREP FOR TRICH, YEAST, CLUE
Trichomonas Exam: NEGATIVE
Yeast Exam: NEGATIVE

## 2020-06-05 MED ORDER — METRONIDAZOLE 500 MG PO TABS: 500.0000 mg | ORAL_TABLET | Freq: Two times a day (BID) | ORAL | 0 refills | Status: AC

## 2020-06-05 NOTE — Progress Notes (Signed)
[facility] Department STI clinic/screening visit  Subjective:  Courtney Byrd is a 30 y.o. female being seen today for an STI screening visit. The patient reports they do have symptoms.  Patient reports that they do not desire a pregnancy in the next year.   They reported they are not interested in discussing contraception today.  No LMP recorded.   Patient has the following medical conditions:   Patient Active Problem List   Diagnosis Date Noted  . Smoker 1/2 ppd 09/04/2019  . Marijuana abuse daily 09/04/2019  . Alcohol use (1 bottle Petrone) qoweekend last use 09/03/19 09/04/2019  . Depression dx'd 2016 09/04/2019  . Cocaine abuse (HCC) 07/25/2013  . Mood disorder (HCC) 07/25/2013    Chief Complaint  Patient presents with  . SEXUALLY TRANSMITTED DISEASE    screening    HPI  Patient reports that she has had internal itching and odor for 1 week.  Denies other symptoms, chronic conditions, and regular medicines.  States her last HIV test was 3 months ago and is not sure when her last pap was done, maybe 3-5 years ago.   See flowsheet for further details and programmatic requirements.    The following portions of the patient's history were reviewed and updated as appropriate: allergies, current medications, past medical history, past social history, past surgical history and problem list.  Objective:  There were no vitals filed for this visit.  Physical Exam Constitutional:      General: She is not in acute distress.    Appearance: Normal appearance.  HENT:     Head: Normocephalic and atraumatic.     Comments: No nits,lice, or hair loss. No cervical, supraclavicular or axillary adenopathy.    Mouth/Throat:     Mouth: Mucous membranes are moist.     Pharynx: Oropharynx is clear. No oropharyngeal exudate or posterior oropharyngeal erythema.  Eyes:     Conjunctiva/sclera: Conjunctivae normal.  Pulmonary:     Effort: Pulmonary effort is normal.  Abdominal:      Palpations: Abdomen is soft. There is no mass.     Tenderness: There is no abdominal tenderness. There is no guarding or rebound.  Genitourinary:    General: Normal vulva.     Rectum: Normal.     Comments: External genitalia/pubic area without nits, lice, edema, erythema, lesions and inguinal adenopathy. Vagina with normal mucosa and moderate amount of white discharge, pH=>4.5. Cervix without visible lesions. Uterus firm, mobile, nt, no masses, no CMT, no adnexal tenderness or fullness. Musculoskeletal:     Cervical back: Neck supple. No tenderness.  Skin:    General: Skin is warm and dry.     Findings: No bruising, erythema, lesion or rash.  Neurological:     Mental Status: She is alert and oriented to person, place, and time.  Psychiatric:        Mood and Affect: Mood normal.        Behavior: Behavior normal.        Thought Content: Thought content normal.        Judgment: Judgment normal.      Assessment and Plan:  Courtney Byrd is a 30 y.o. female presenting to the [facility] Department for STI screening  1. Screening for STD (sexually transmitted disease) Patient into clinic with symptoms. Reviewed wet mount results with patient. Rec condoms with all sex. Await test results.  Counseled that RN will call if needs to RTC for treatment once results are back. - WET PREP FOR  TRICH, YEAST, CLUE - Gonococcus culture - Chlamydia/Gonorrhea [place] [facility] - HIV [place] [facility] - Syphilis Serology, [place] [facility] - Gonococcus culture  2. BV (bacterial vaginosis) Will treat with Metronidazole 500 mg #14 1 po BID for 7 days with food, no EtOH for 24 hr before and until 72 hr after completing medicine. No sex for 14 days. Enc to use OTC antifungal cream if has increased itching during or after antibiotic use. - metroNIDAZOLE (FLAGYL) 500 MG tablet; Take 1 tablet (500 mg total) by mouth 2 (two) times daily for 7 days.  Dispense: 14 tablet; Refill: 0     No follow-ups on  file.  No future appointments.  Matt Holmes, PA

## 2020-06-06 NOTE — Progress Notes (Signed)
Chart reviewed by Pharmacist  Suzanne Walker PharmD, Contract Pharmacist at [facility]  

## 2020-06-10 LAB — GONOCOCCUS CULTURE

## 2020-08-05 ENCOUNTER — Ambulatory Visit: Payer: Self-pay | Admitting: Advanced Practice Midwife

## 2020-08-05 ENCOUNTER — Encounter: Payer: Self-pay | Admitting: Advanced Practice Midwife

## 2020-08-05 DIAGNOSIS — Z113 Encounter for screening for infections with a predominantly sexual mode of transmission: Secondary | ICD-10-CM

## 2020-08-05 DIAGNOSIS — E663 Overweight: Secondary | ICD-10-CM | POA: Insufficient documentation

## 2020-08-05 LAB — WET PREP FOR TRICH, YEAST, CLUE
Trichomonas Exam: NEGATIVE
Yeast Exam: NEGATIVE

## 2020-08-05 NOTE — Progress Notes (Addendum)
[facility] Department STI clinic/screening visit  Subjective:  Courtney Byrd is a 30 y.o.SBF nullip smoker female being seen today for an STI screening visit. The patient reports they do not have symptoms.  Patient reports that they do not desire a pregnancy in the next year.   They reported they are not interested in discussing contraception today.  Patient's last menstrual period was 07/23/2020.   Patient has the following medical conditions:   Patient Active Problem List   Diagnosis Date Noted  . Overweight 176 lbs 08/05/2020  . Smoker 1/2 ppd 09/04/2019  . Marijuana abuse daily 09/04/2019  . Alcohol use (1 bottle Petrone) q weekend last use 08/03/20 09/04/2019  . Depression dx'd 2016 09/04/2019  . Cocaine abuse (HCC) 07/25/2013  . Mood disorder (HCC) 07/25/2013    Chief Complaint  Patient presents with  . SEXUALLY TRANSMITTED DISEASE    screening    HPI  Patient reports LMP 07/23/20.  Last MJ today. Last sex 08/03/20 without condom; with current partner >1 year; 1 sex partner in last 3 mo. Last ETOH 08/03/20 (1/2 bottle liquor) q weekend. Vapes. Smoking 5-10 cpd. Last cigar 5 years ago.  Last HIV test per patient/review of record was 06/05/20 Patient reports last pap was 4 years ago?  See flowsheet for further details and programmatic requirements.    The following portions of the patient's history were reviewed and updated as appropriate: allergies, current medications, past medical history, past social history, past surgical history and problem list.  Objective:  There were no vitals filed for this visit.  Physical Exam Vitals and nursing note reviewed.  Constitutional:      Appearance: Normal appearance.  HENT:     Head: Normocephalic and atraumatic.     Mouth/Throat:     Mouth: Mucous membranes are moist.     Pharynx: Oropharynx is clear. No oropharyngeal exudate or posterior oropharyngeal erythema.     Comments: Negative lymphadenopathy Eyes:      Conjunctiva/sclera: Conjunctivae normal.  Pulmonary:     Effort: Pulmonary effort is normal.  Chest:  Breasts:     Right: No axillary adenopathy or supraclavicular adenopathy.     Left: No axillary adenopathy or supraclavicular adenopathy.    Abdominal:     Palpations: Abdomen is soft. There is no mass.     Tenderness: There is no abdominal tenderness. There is no rebound.     Comments: Soft without masses or tenderness  Genitourinary:    General: Normal vulva.     Exam position: Lithotomy position.     Pubic Area: No rash or pubic lice.      Labia:        Right: No rash or lesion.        Left: No rash or lesion.      Vagina: Normal. No vaginal discharge (grey leukorrhea, ph>4.5), erythema, bleeding or lesions.     Cervix: Normal.     Uterus: Normal.      Adnexa: Right adnexa normal and left adnexa normal.     Rectum: Normal.  Lymphadenopathy:     Head:     Right side of head: No preauricular or posterior auricular adenopathy.     Left side of head: No preauricular or posterior auricular adenopathy.     Cervical: No cervical adenopathy.     Upper Body:     Right upper body: No supraclavicular or axillary adenopathy.     Left upper body: No supraclavicular or axillary adenopathy.  Lower Body: No right inguinal adenopathy. No left inguinal adenopathy.  Skin:    General: Skin is warm and dry.     Findings: No rash.  Neurological:     Mental Status: She is alert and oriented to person, place, and time.      Assessment and Plan:  Courtney Byrd is a 30 y.o. female presenting to the [facility] Department for STI screening  1. Screening examination for venereal disease Treat wet mount per standing orders Immunization nurse consult - WET PREP FOR TRICH, YEAST, CLUE - Gonococcus culture - Chlamydia/Gonorrhea [place] [facility] - HIV/HCV [place] [facility] - Syphilis Serology, [place] [facility] - Gonococcus culture  2. Overweight      No follow-ups on file.  No  future appointments.  Alberteen Spindle, CNM

## 2020-08-05 NOTE — Progress Notes (Signed)
Wet mount reviewed.  No treatment required, per standing orders. Berdie Ogren, RN

## 2020-08-09 LAB — GONOCOCCUS CULTURE

## 2020-08-11 LAB — HM HIV SCREENING LAB: HM HIV Screening: NEGATIVE

## 2020-08-14 NOTE — Addendum Note (Signed)
Addended by: Arnetha Courser on: 08/14/2020 11:38 AM   Modules accepted: Orders

## 2020-08-26 ENCOUNTER — Emergency Department
Admission: EM | Admit: 2020-08-26 | Discharge: 2020-08-26 | Discharge status: Against Medical Advice | Payer: Medicaid Other | Attending: Emergency Medicine | Admitting: Emergency Medicine

## 2020-08-26 ENCOUNTER — Other Ambulatory Visit: Payer: Self-pay

## 2020-08-26 DIAGNOSIS — R059 Cough, unspecified: Secondary | ICD-10-CM | POA: Insufficient documentation

## 2020-08-26 DIAGNOSIS — R0981 Nasal congestion: Secondary | ICD-10-CM | POA: Insufficient documentation

## 2020-08-26 DIAGNOSIS — Z5321 Procedure and treatment not carried out due to patient leaving prior to being seen by health care provider: Secondary | ICD-10-CM | POA: Insufficient documentation

## 2020-08-26 NOTE — ED Triage Notes (Signed)
Pt comes with c/o cough and congestion for over week. Pt states sinus infection maybe. Pt states green mucus and nasal congestion.

## 2020-10-10 ENCOUNTER — Ambulatory Visit (LOCAL_COMMUNITY_HEALTH_CENTER): Payer: Medicaid Other | Admitting: Advanced Practice Midwife

## 2020-10-10 ENCOUNTER — Other Ambulatory Visit: Payer: Self-pay

## 2020-10-10 VITALS — BP 102/73 | Ht 63.0 in | Wt 171.8 lb

## 2020-10-10 DIAGNOSIS — Z3009 Encounter for other general counseling and advice on contraception: Secondary | ICD-10-CM

## 2020-10-10 DIAGNOSIS — Z01419 Encounter for gynecological examination (general) (routine) without abnormal findings: Secondary | ICD-10-CM | POA: Diagnosis not present

## 2020-10-10 DIAGNOSIS — E669 Obesity, unspecified: Secondary | ICD-10-CM | POA: Insufficient documentation

## 2020-10-10 LAB — WET PREP FOR TRICH, YEAST, CLUE
Trichomonas Exam: NEGATIVE
Yeast Exam: NEGATIVE

## 2020-10-10 LAB — HEMOGLOBIN, FINGERSTICK: Hemoglobin: 12.2 g/dL (ref 11.1–15.9)
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
## 2020-10-10 NOTE — Progress Notes (Signed)
Pt here for PE, Pap and STD check.  Wet mount results reviewed, no treatment required.  Pt declined condoms and other forms of BC. Berdie Ogren, RN

## 2020-10-14 LAB — HM HEPATITIS C SCREENING LAB: HM Hepatitis Screen: NEGATIVE

## 2020-10-14 LAB — HM HIV SCREENING LAB: HM HIV Screening: NEGATIVE

## 2020-10-14 NOTE — Progress Notes (Signed)
results

## 2020-10-17 ENCOUNTER — Encounter: Payer: Self-pay | Admitting: Advanced Practice Midwife

## 2020-10-17 LAB — IGP, RFX APTIMA HPV ASCU: PAP Smear Comment: 0

## 2020-10-17 LAB — HPV APTIMA: HPV Aptima: POSITIVE — AB

## 2020-10-20 ENCOUNTER — Telehealth: Payer: Self-pay | Admitting: Advanced Practice Midwife

## 2020-10-20 ENCOUNTER — Encounter: Payer: Self-pay | Admitting: Advanced Practice Midwife

## 2020-10-20 DIAGNOSIS — R87619 Unspecified abnormal cytological findings in specimens from cervix uteri: Secondary | ICD-10-CM | POA: Insufficient documentation

## 2020-10-20 NOTE — Telephone Encounter (Signed)
T/C to pt in response to her my chart message about her 10/10/20 abnormal pap and need for colpo. Informed pt about HPV and what colpo is and info it will give. Informed pt she will receive a call from Glenna Fellows, RN re: colpo date and place. All questions answered

## 2020-10-23 ENCOUNTER — Telehealth: Payer: Self-pay | Admitting: Nurse Practitioner

## 2020-10-23 NOTE — Telephone Encounter (Signed)
Telephone call to patient regarding PAP results.  PAP showed ASC-US.  Patient will need a Colpo per Arnetha Courser, CNM.  Patient desires to have Colpo done at [facility].  Referral made to [facility] on 10/23/2020. Glenna Fellows, RN

## 2022-10-27 ENCOUNTER — Ambulatory Visit: Payer: Self-pay

## 2022-10-27 ENCOUNTER — Other Ambulatory Visit: Payer: Self-pay

## 2022-12-16 ENCOUNTER — Emergency Department
Admission: EM | Admit: 2022-12-16 | Discharge: 2022-12-16 | Disposition: A | Discharge status: Home / Self Care | Payer: Medicaid Other | Attending: Student in an Organized Health Care Education/Training Program | Admitting: Student in an Organized Health Care Education/Training Program

## 2022-12-16 ENCOUNTER — Other Ambulatory Visit: Payer: Self-pay

## 2022-12-16 DIAGNOSIS — Z1152 Encounter for screening for COVID-19: Secondary | ICD-10-CM | POA: Insufficient documentation

## 2022-12-16 DIAGNOSIS — J069 Acute upper respiratory infection, unspecified: Secondary | ICD-10-CM | POA: Insufficient documentation

## 2022-12-16 LAB — SARS CORONAVIRUS 2 BY RT PCR: SARS Coronavirus 2 by RT PCR: NEGATIVE

## 2022-12-16 MED ORDER — ONDANSETRON HCL 4 MG PO TABS: 4.0000 mg | ORAL_TABLET | Freq: Every day | ORAL | 1 refills | Status: AC | PRN

## 2022-12-16 MED ORDER — IBUPROFEN 800 MG PO TABS
800.0000 mg | ORAL_TABLET | Freq: Once | ORAL | Status: AC
  Administered 2022-12-16: 800 mg via ORAL
  Filled 2022-12-16: qty 1

## 2022-12-16 NOTE — ED Provider Notes (Signed)
[facility] Provider Note    Event Date/Time   First MD Initiated Contact with Patient 12/16/22 1340     (approximate)   History   Headache, Nausea, and Emesis   HPI  Courtney Byrd is a 32 y.o. female with PMH of mood disorder, chronic headaches presents for evaluation of congestion, headache with nausea and vomiting for the past week.  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  She is not sure if her symptoms are due to the changing weather or a cold.  She is taken some Tylenol and vitamins to try to boost her immune system.      Physical Exam   Triage Vital Signs: ED Triage Vitals  Encounter Vitals Group     BP 12/16/22 1318 103/66     Systolic BP Percentile --      Diastolic BP Percentile --      Pulse Rate 12/16/22 1318 75     Resp 12/16/22 1318 16     Temp 12/16/22 1318 98.2 F (36.8 C)     Temp Source 12/16/22 1318 Oral     SpO2 12/16/22 1318 97 %     Weight 12/16/22 1319 160 lb (72.6 kg)     Height 12/16/22 1319 5\' 3"  (1.6 m)     Head Circumference --      Peak Flow --      Pain Score 12/16/22 1319 7     Pain Loc --      Pain Education --      Exclude from Growth Chart --     Most recent vital signs: Vitals:   12/16/22 1318  BP: 103/66  Pulse: 75  Resp: 16  Temp: 98.2 F (36.8 C)  SpO2: 97%    General: Awake, no distress.  CV:  Good peripheral perfusion.  RRR. Resp:  Normal effort.  CTAB. Abd:  No distention.  Other:  Oral mucous membranes are moist, no pharyngeal erythema, no tonsillar enlargement or exudates, tender submandibular lymphadenopathy.   ED Results / Procedures / Treatments   Labs (all labs ordered are listed, but only abnormal results are displayed) Labs Reviewed  SARS CORONAVIRUS 2 BY RT PCR    PROCEDURES:  Critical Care performed: No  Procedures   MEDICATIONS ORDERED IN ED: Medications  ibuprofen (ADVIL) tablet 800 mg (800 mg Oral Given 12/16/22  1421)     IMPRESSION / MDM / ASSESSMENT AND PLAN / ED COURSE  I reviewed the triage vital signs and the nursing notes.                             32 year old female presents for evaluation of URI symptoms.  Vital signs stable patient NAD on exam.  Differential diagnosis includes, but is not limited to, COVID, flu, RSV, viral URI.  Patient's presentation is most consistent with acute, uncomplicated illness.  Patient tested negative for COVID.  Physical exam is reassuring, patient is hemodynamically stable.  I believe she has another viral illness.  I will advise her on symptomatic management.  Patient voiced understanding, all questions were answered and she was stable at discharge.   FINAL CLINICAL IMPRESSION(S) / ED DIAGNOSES   Final diagnoses:  Viral upper respiratory tract infection     Rx / DC Orders   ED Discharge Orders          Ordered    ondansetron (ZOFRAN) 4  MG tablet  Daily PRN        12/16/22 1433             Note:  This document was prepared using Dragon voice recognition software and may include unintentional dictation errors.   Cameron Ali, PA-C 12/16/22 1434    Willy Eddy, MD 12/16/22 1739

## 2022-12-16 NOTE — Discharge Instructions (Addendum)
COVID test was negative today.  You can take 650 mg of Tylenol and 600 mg of ibuprofen every 6 hours as needed for headaches.  You can take an over-the-counter cold medication like DayQuil which contains a cough medication, Tylenol and decongestant.  You can take the Zofran for nausea.  You can continue to take vitamins.  Return to the ED if you have any new or worsening symptoms.

## 2022-12-16 NOTE — ED Triage Notes (Signed)
Pt reports congested for the past week, nauseated, emesis, as well as a headache

## 2022-12-17 ENCOUNTER — Ambulatory Visit: Payer: Self-pay

## 2023-02-03 ENCOUNTER — Encounter: Payer: Self-pay | Admitting: Physician Assistant

## 2023-02-03 ENCOUNTER — Ambulatory Visit: Payer: Self-pay | Admitting: Physician Assistant

## 2023-02-03 DIAGNOSIS — A64 Unspecified sexually transmitted disease: Secondary | ICD-10-CM

## 2023-02-03 DIAGNOSIS — A599 Trichomoniasis, unspecified: Secondary | ICD-10-CM

## 2023-02-03 LAB — WET PREP FOR TRICH, YEAST, CLUE
Trichomonas Exam: POSITIVE — AB
Yeast Exam: NEGATIVE

## 2023-02-03 LAB — HM HIV SCREENING LAB: HM HIV Screening: NEGATIVE

## 2023-02-03 MED ORDER — METRONIDAZOLE 500 MG PO TABS: 500.0000 mg | ORAL_TABLET | Freq: Two times a day (BID) | ORAL | Status: AC

## 2023-02-03 NOTE — Progress Notes (Signed)
Wet mount reviewed, patient treated for Trich per SO..Yalissa Fink Brewer-Jensen, RN  ?

## 2023-02-03 NOTE — Progress Notes (Signed)
[facility]  STI clinic/screening visit [address] [place] [address] [phone]  Subjective:  Courtney Byrd is a 32 y.o. female being seen today for an STI screening visit. The patient reports they do have symptoms.  Patient reports that they do not desire a pregnancy in the next year.   They reported they are not interested in discussing contraception today.    No LMP recorded.  Patient has the following medical conditions:   Patient Active Problem List   Diagnosis Date Noted   Abnormal Pap smear of cervix 10/10/20 ASCUS HPV+ 10/20/2020   Obesity BMI=30.4 10/10/2020   Smoker 1/2 ppd 09/04/2019   Marijuana abuse daily 09/04/2019   Alcohol use (1 bottle Petrone) q weekend last use 08/03/20 09/04/2019   Depression dx'd 2016 09/04/2019   Cocaine abuse (HCC) 07/25/2013   Mood disorder (HCC) 07/25/2013    Chief Complaint  Patient presents with   SEXUALLY TRANSMITTED DISEASE    Woman presents for STD screen, c/o 2-week h/o itchy vaginal discharge. No known contact to STI.   Patient reports she is in a hurry today and only wants STI eval. Answers to provider questions are short and limited.  Does the patient using douching products? No  Last HIV test per patient/review of record was  Lab Results  Component Value Date   HMHIVSCREEN Negative - Validated 10/14/2020   No results found for: "HIV"   Last HEPC test per patient/review of record was  Lab Results  Component Value Date   HMHEPCSCREEN Negative-Validated 10/14/2020   No components found for: "HEPC"   Last HEPB test per patient/review of record was No components found for: "HMHEPBSCREEN" No components found for: "HEPC"   Patient reports last pap was No results found for: "DIAGPAP"  Lab Results  Component Value Date   SPECADGYN Comment 10/10/2020    Screening for MPX risk: Does the patient have an unexplained rash? No Is the patient MSM? No Does the patient endorse multiple  sex partners or anonymous sex partners? No Did the patient have close or sexual contact with a person diagnosed with MPX? No Has the patient traveled outside the [place] where MPX is endemic? No Is there a high clinical suspicion for MPX-- evidenced by one of the following No  -Unlikely to be chickenpox  -Lymphadenopathy  -Rash that present in same phase of evolution on any given body part See flowsheet for further details and programmatic requirements.   Immunization history:  Immunization History  Administered Date(s) Administered   Hepatitis A, Ped/Adol-2 Dose 11/11/2007, 07/29/2009   Hepatitis B, ADULT 08/13/92, 04/24/1993, 06/18/1994   MMR 06/18/1994   Meningococcal Conjugate 01/24/2006   Tdap 01/24/2006     The following portions of the patient's history were reviewed and updated as appropriate: allergies, current medications, past medical history, past social history, past surgical history and problem list.  Objective:  There were no vitals filed for this visit.  Physical Exam Vitals and nursing note reviewed.  Constitutional:      Appearance: Normal appearance.  HENT:     Head: Normocephalic and atraumatic.     Mouth/Throat:     Mouth: Mucous membranes are moist.     Pharynx: Oropharynx is clear. No oropharyngeal exudate or posterior oropharyngeal erythema.  Pulmonary:     Effort: Pulmonary effort is normal.  Abdominal:     General: Abdomen is flat.     Palpations: There is no mass.     Tenderness: There is no  abdominal tenderness. There is no rebound.  Genitourinary:    Comments: pH = not assessed due to menses Pt declined pelvic exam, opted to self-collect wet prep and NAAT for GC/chlam Lymphadenopathy:     Head:     Right side of head: No preauricular or posterior auricular adenopathy.     Left side of head: No preauricular or posterior auricular adenopathy.     Cervical: No cervical adenopathy.     Upper Body:     Right upper body: No supraclavicular, axillary  or epitrochlear adenopathy.     Left upper body: No supraclavicular, axillary or epitrochlear adenopathy.  Skin:    General: Skin is warm and dry.     Findings: No rash.  Neurological:     Mental Status: She is alert and oriented to person, place, and time.      Assessment and Plan:  Dalaya Saint is a 32 y.o. female presenting to the [facility] Department for STI screening  1. STD (female) Pt notified vaginal testing may be compromised by menstrual blood (first day of menses today). If symptoms continue, recommend return for testing after menses resolve. - Syphilis Serology, [facility] - HIV [facility] - WET PREP FOR TRICH, YEAST, CLUE - Chlamydia/Gonorrhea [facility]   Patient accepted all screenings including vaginal CT/GC and bloodwork for HIV/RPR, and wet prep. Patient meets criteria for HepB screening? No. Ordered? no Patient meets criteria for HepC screening? No. Ordered? no Note history provided by patient today is not consistent with history in chart regarding h/o polysubstance abuse. Treat wet prep per standing order Discussed time line for [facility] results and that patient will be called with positive results and encouraged patient to call if she had not heard in 2 weeks.  Counseled to return or seek care for continued or worsening symptoms Recommended repeat testing in 3 months with positive results. Recommended condom use with all sex  Patient is currently using  condoms  to prevent pregnancy.    Return in about 6 months (around 08/03/2023) for STI screening.  No future appointments.  Landry Dyke, PA-C

## 2023-02-03 NOTE — Progress Notes (Signed)
Here for STD testing..Courtney Liptak Brewer-Jensen, RN  

## 2023-09-06 ENCOUNTER — Ambulatory Visit

## 2023-09-12 ENCOUNTER — Ambulatory Visit: Admitting: Family Medicine

## 2023-09-12 ENCOUNTER — Encounter: Payer: Self-pay | Admitting: Family Medicine

## 2023-09-12 VITALS — BP 94/66 | HR 71 | Ht 63.0 in | Wt 168.0 lb

## 2023-09-12 DIAGNOSIS — Z124 Encounter for screening for malignant neoplasm of cervix: Secondary | ICD-10-CM

## 2023-09-12 DIAGNOSIS — Z113 Encounter for screening for infections with a predominantly sexual mode of transmission: Secondary | ICD-10-CM

## 2023-09-12 DIAGNOSIS — Z309 Encounter for contraceptive management, unspecified: Secondary | ICD-10-CM | POA: Diagnosis not present

## 2023-09-12 LAB — WET PREP FOR TRICH, YEAST, CLUE
Clue Cell Exam: NEGATIVE
Trichomonas Exam: NEGATIVE
Yeast Exam: NEGATIVE

## 2023-09-12 LAB — HM HEPATITIS C SCREENING LAB: HM Hepatitis Screen: NEGATIVE

## 2023-09-12 LAB — HM HIV SCREENING LAB: HM HIV Screening: NEGATIVE

## 2023-09-12 NOTE — Progress Notes (Signed)
 [employer] [facility] DEPARTMENT [facility] [address] [place] [address] Main phone: [phone]  Family Planning Visit - Initial Visit  Subjective:  Courtney Byrd is a 33 y.o.  [id number]   being seen today for an initial annual visit and to discuss reproductive life planning.  The patient is currently using pregnant or seeking pregnancy for pregnancy prevention.   Patient has the following medical conditions: Patient Active Problem List   Diagnosis Date Noted   Abnormal Pap smear of cervix 10/10/20 ASCUS HPV+ 10/20/2020   Obesity BMI=30.4 10/10/2020   Smoker 1/2 ppd 09/04/2019   Marijuana abuse daily 09/04/2019   Alcohol use (1 bottle Petrone) q weekend last use 08/03/20 09/04/2019   Depression dx'd 2016 09/04/2019   Cocaine abuse (HCC) 07/25/2013   Mood disorder (HCC) 07/25/2013   Chief Complaint  Patient presents with   Annual Exam   HPI Patient reports external and internal pruritus. No discharge, odor, or inguinal lymphadenopathy, rashes, sores.   Review of Systems  Constitutional:  Negative for fever, malaise/fatigue and weight loss.  Respiratory:  Negative for shortness of breath.   Cardiovascular:  Negative for chest pain and palpitations.   Diabetes screening This patient is 33 y.o. with a BMI of Body mass index is 29.76 kg/m.Courtney Byrd  Is patient eligible for diabetes screening (age >35 and BMI >25)?  no  Was Hgb A1c ordered? not applicable  STI screening Patient reports 1 of partners in last year.  Does this patient desire STI screening?  Yes  Hepatitis C screening Has patient been screened once for HCV in the past?  No  No results found for: "HCVAB"  Does the patient meet criteria for HCV testing? Yes   Hepatitis B screening Does the patient meet criteria for HBV testing? Yes  Cervical Cancer Screening  Result Date Procedure Results Follow-ups  10/10/2020 IGP, rfx Aptima HPV ASCU DIAGNOSIS:: Comment (A) PAP Reflex:  Comment Specimen adequacy:: Comment Clinician Provided ICD10: Comment Performed by:: Comment Electronically signed by:: Comment PAP Smear Comment: . PATHOLOGIST PROVIDED ICD10:: Comment Note:: Comment Test Methodology: Comment     Health Maintenance Due  Topic Date Due   Pneumococcal Vaccine 40-14 Years old (1 of 2 - PCV) Never done   DTaP/Tdap/Td (2 - Td or Tdap) 01/25/2016   Cervical Cancer Screening (Pap smear)  10/10/2021   COVID-19 Vaccine (1 - 2024-25 season) Never done    The following portions of the patient's history were reviewed and updated as appropriate: allergies, current medications, past family history, past medical history, past social history, past surgical history and problem list. Problem list updated.  See flowsheet for further details and programmatic requirements Hyperlink available at the top of the signed note in blue.  Flow sheet content below:  Pregnancy Intention Screening Does the patient want to become pregnant in the next year?: Yes Does the patient's partner want to become pregnant in the next year?: Yes Does the patient currently take folic acid or women's MVI, or a prenatal viitamin?: Promoted daily consumption of MVI with folic acid if capable of conceiving Does the patient or their partner want to learn more about planning a healthy pregnancy?: Provided Preconception Counseling Would the patient like to discuss contraceptive options today?: No Results Follow up Password: [id number] Is it okay to contact you by mail?: No Contraception History Past methods of contraception used by patient:: Pregnant or seeking Pregnancy Sexual History What age did you start your period?: 13 How often do you  have your period?: monthly Date of last sex?: 09/11/23 Has the patient had unprotected sex within the last 5 days?: Yes Do you have sex with men, women, both men and women?: Men only In the past 2 months how many partners have you had sex with?: 1 In the past  12 months, how many partners have you had sex with?: 1 Is it possible that any of your sex partners in the past 12 months had sex with someone else whild they were still in a sexual relationship with you?: No What ways do you have sex?: Vaginal, Oral Do you or your partner use condoms and/or dental dams every time you have vaginal, oral or anal sex?: No Do you douche?: No Date of last HIV test?: 02/03/23 Have you ever had an STD?: No Have any of your partners had an STD?: No Have you or your partner ever shot up drugs?: No Have any of your partners used drugs in the past?: Yes Have you or your partners exchanged money or drugs for sex?: No  Objective:   Vitals:   09/12/23 0903  BP: 94/66  Pulse: 71  Weight: 168 lb (76.2 kg)  Height: 5\' 3"  (1.6 m)    Physical Exam Vitals and nursing note reviewed. Chaperone present: Patient politely declines chaperone.  Constitutional:      General: She is not in acute distress.    Appearance: Normal appearance. She is not toxic-appearing.  HENT:     Head: Normocephalic and atraumatic.     Mouth/Throat:     Mouth: Mucous membranes are moist.     Pharynx: Oropharynx is clear. No oropharyngeal exudate or posterior oropharyngeal erythema.  Eyes:     General: No scleral icterus.       Right eye: No discharge.        Left eye: No discharge.     Conjunctiva/sclera: Conjunctivae normal.  Cardiovascular:     Rate and Rhythm: Normal rate.     Pulses: Normal pulses.  Pulmonary:     Effort: Pulmonary effort is normal.  Abdominal:     General: Abdomen is flat.     Palpations: There is no mass.     Tenderness: There is no abdominal tenderness. There is no rebound.  Genitourinary:    General: Normal vulva.     Exam position: Lithotomy position.     Pubic Area: No rash or pubic lice.      Tanner stage (genital): 5.     Labia:        Right: No rash or lesion.        Left: No rash or lesion.      Vagina: Normal. No vaginal discharge, erythema,  bleeding or lesions.     Cervix: No cervical motion tenderness, discharge, friability, lesion or erythema.     Uterus: Normal. Not deviated, not fixed and not tender.      Adnexa: Right adnexa normal and left adnexa normal.       Right: No mass or tenderness.         Left: No mass or tenderness.       Rectum: Normal.     Comments: pH = 6 Musculoskeletal:     Cervical back: Neck supple. No rigidity or tenderness.  Lymphadenopathy:     Head:     Right side of head: No preauricular or posterior auricular adenopathy.     Left side of head: No preauricular or posterior auricular adenopathy.     Cervical: No  cervical adenopathy.     Right cervical: No superficial or posterior cervical adenopathy.    Left cervical: No superficial or posterior cervical adenopathy.     Upper Body:     Right upper body: No supraclavicular adenopathy.     Left upper body: No supraclavicular adenopathy.  Skin:    General: Skin is warm and dry.     Capillary Refill: Capillary refill takes less than 2 seconds.     Coloration: Skin is not jaundiced or pale.     Findings: No bruising, erythema, lesion or rash.  Neurological:     General: No focal deficit present.     Mental Status: She is alert and oriented to person, place, and time.  Psychiatric:        Mood and Affect: Mood normal.        Behavior: Behavior normal.    Assessment and Plan:  Courtney Byrd is a 33 y.o. female presenting to the [facility] Department for an initial annual wellness/contraceptive visit  Contraception counseling:  Reviewed options based on patient desire and reproductive life plan. Patient is interested in No Method - No Contraceptive Precautions. She is trying for pregnancy.   Risks, benefits, and typical effectiveness rates were reviewed.  Questions were answered.  Written information was also given to the patient to review.    The patient will follow up in  1 years for surveillance.  The patient was told to call  with any further questions, or with any concerns about this method of contraception.  Emphasized use of condoms 100% of the time for STI prevention.  Emergency Contraception Precautions (ECP): Patient assessed for need of ECP. She is not a candidate based on desire for pregnancy.   Screening examination for venereal disease -     WET PREP FOR TRICH, YEAST, CLUE -     Beta hCG quant (ref lab) -     Chlamydia/Gonorrhea [facility] -     Syphilis Serology, [facility] -     HIV/HCV [place] [facility] -     HBV Antigen/Antibody [facility] -     Gonococcus culture  Cervical cancer screening -     IGP, Aptima HPV   No follow-ups on file.  No future appointments.  Jack Marts, MD

## 2023-09-12 NOTE — Patient Instructions (Signed)
 STI screening - Today we obtained a vaginal swab to screen for gonorrhea, chlamydia, and trichomonas and oral swab to screen for gonorrhea - We also obtained a blood sample to screen for HIV, syphilis, Hepatitis B, and Hepatitis C  - If the results are abnormal, I will give you a call.    Estimated time frame for results collected at the [facility] Department: Same day Trichomonas Yeast BV (bacterial vaginosis)  Within 1-2 weeks Gonorrhea Chlamydia  Within 2-3 weeks HIV Syphilis Hepatitis B Hepatitis C

## 2023-09-13 LAB — HBV ANTIGEN/ANTIBODY STATE LAB
Hep B Core Total Ab: NONREACTIVE
Hep B S Ab: NONREACTIVE
Hepatitis B Surface Antigen: NONREACTIVE

## 2023-09-15 ENCOUNTER — Ambulatory Visit: Payer: Self-pay | Admitting: Family Medicine

## 2023-09-15 LAB — IGP, APTIMA HPV
HPV Aptima: NEGATIVE
PAP Smear Comment: 0

## 2023-09-15 NOTE — Progress Notes (Signed)
 Wet prep reviewed in clinic with patient. Negative.   Courtney Fee, MD 09/15/23  6:50 PM

## 2023-09-15 NOTE — Progress Notes (Signed)
 PAP smear reviewed, result: NILM (normal), HPV negative. Based on this result and patient's prior cervical cancer screenings, [facility] currently recommends repeat PAP smear in 1 year with HPV co-testing.  Tempie Fee, MD 09/15/23  6:47 PM

## 2023-09-18 LAB — GONOCOCCUS CULTURE

## 2023-09-23 ENCOUNTER — Encounter: Payer: Self-pay | Admitting: Family Medicine

## 2023-11-10 ENCOUNTER — Telehealth: Payer: Self-pay | Admitting: Family Medicine

## 2023-11-10 NOTE — Telephone Encounter (Signed)
 Called [facility] regarding missing beta HCG result from 09/12/23.   [facility] representative Leotis says this is an open order, which means the blood work was not collected the day of the visit. No note on file to indicate if patient left before collection or if this was an error on our part.  Given clinical history and note, no redraw necessary at this time.    Dorothyann Helling, MD 11/10/23  11:08 AM
